# Patient Record
Sex: Female | Born: 1990 | Race: Black or African American | Hispanic: No | Smoking: Former smoker
Health system: Southern US, Community
[De-identification: ages and names within clinical notes are randomized; demographics above are authoritative.]

## PROBLEM LIST (undated history)

## (undated) DIAGNOSIS — F909 Attention-deficit hyperactivity disorder, unspecified type: Secondary | ICD-10-CM

## (undated) DIAGNOSIS — K589 Irritable bowel syndrome without diarrhea: Secondary | ICD-10-CM

## (undated) DIAGNOSIS — K219 Gastro-esophageal reflux disease without esophagitis: Secondary | ICD-10-CM

## (undated) DIAGNOSIS — F32A Depression, unspecified: Secondary | ICD-10-CM

## (undated) DIAGNOSIS — F329 Major depressive disorder, single episode, unspecified: Secondary | ICD-10-CM

## (undated) HISTORY — DX: Depression, unspecified: F32.A

## (undated) HISTORY — DX: Major depressive disorder, single episode, unspecified: F32.9

## (undated) HISTORY — DX: Gastro-esophageal reflux disease without esophagitis: K21.9

## (undated) HISTORY — DX: Attention-deficit hyperactivity disorder, unspecified type: F90.9

## (undated) HISTORY — DX: Irritable bowel syndrome, unspecified: K58.9

---
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
## 2012-01-21 ENCOUNTER — Telehealth: Payer: Self-pay | Admitting: *Deleted

## 2012-01-21 DIAGNOSIS — N938 Other specified abnormal uterine and vaginal bleeding: Secondary | ICD-10-CM

## 2012-01-21 NOTE — Telephone Encounter (Signed)
Pt mother called saying pt is still having irregular bleeding while on her Ortho-Novum 1/35 she is second pack and now on period. Mother states that when pt was taking medication twice daily x1 as noted on 12/04/11 she would not bleed, but once she started back on 1 po daily bleeding would start. Mother said pt has had a lot of bleeding while on these pills.  Pt would like to try something else, OV offered, mother wanted me to check with you. Please advise

## 2012-01-22 MED ORDER — NORETHINDRONE-MESTRANOL 1-50 MG-MCG PO TABS: 1.0000 | ORAL_TABLET | Freq: Every day | ORAL | Status: DC

## 2012-01-22 NOTE — Telephone Encounter (Signed)
She tried Depo-Provera although one of the side effects is irregular bleeding. It's not heavy bleeding to spotting on and off. A lot of patient's stop bleeding with it so it could be a choice but she would have to accept the possibility of irregular bleeding.

## 2012-01-22 NOTE — Telephone Encounter (Signed)
Spoke with Marylu Lund ( pt mother) and pt told her last night that she would like to try depo-provera shot, her mother wasn't sure if this was an option for her? Please advise

## 2012-01-22 NOTE — Telephone Encounter (Signed)
Going to switch her to a higher dose pill Ortho-Novum 1/50 x3 packs and have her call me and we'll see how she's doing.

## 2012-01-23 MED ORDER — MEDROXYPROGESTERONE ACETATE 150 MG/ML IM SUSP: 150.0000 mg | Freq: Once | INTRAMUSCULAR | Status: DC

## 2012-01-23 NOTE — Telephone Encounter (Signed)
Rebecca Dyer informed with the below note, pt will have HCG drawn and with negative results she will have depo-shot done while on cycle.

## 2012-01-23 NOTE — Telephone Encounter (Signed)
Pt mother said pt is willing to give it a try. She was informed with all the below.

## 2012-01-23 NOTE — Telephone Encounter (Signed)
To receive the Depo-Provera then she needs to be on irregular menses or be over 2 weeks from intercourse with a negative hCG. Otherwise she can go ahead and receive Depo-Provera 150 mg every 3 months.

## 2012-02-10 ENCOUNTER — Other Ambulatory Visit: Payer: BC Managed Care – PPO

## 2012-02-10 DIAGNOSIS — N938 Other specified abnormal uterine and vaginal bleeding: Secondary | ICD-10-CM

## 2012-02-10 LAB — HCG, SERUM, QUALITATIVE: Preg, Serum: NEGATIVE

## 2012-02-19 ENCOUNTER — Ambulatory Visit (INDEPENDENT_AMBULATORY_CARE_PROVIDER_SITE_OTHER): Payer: BC Managed Care – PPO | Admitting: Anesthesiology

## 2012-02-19 DIAGNOSIS — Z309 Encounter for contraceptive management, unspecified: Secondary | ICD-10-CM

## 2012-02-19 MED ORDER — MEDROXYPROGESTERONE ACETATE 150 MG/ML IM SUSP
150.0000 mg | Freq: Once | INTRAMUSCULAR | Status: AC
  Administered 2012-02-19: 150 mg via INTRAMUSCULAR

## 2012-02-26 ENCOUNTER — Encounter: Payer: Self-pay | Admitting: Internal Medicine

## 2012-02-26 ENCOUNTER — Ambulatory Visit (INDEPENDENT_AMBULATORY_CARE_PROVIDER_SITE_OTHER): Payer: BC Managed Care – PPO | Admitting: Internal Medicine

## 2012-02-26 VITALS — BP 108/66 | HR 73 | Temp 98.2°F | Ht 63.5 in | Wt 168.0 lb

## 2012-02-26 DIAGNOSIS — Z0000 Encounter for general adult medical examination without abnormal findings: Secondary | ICD-10-CM

## 2012-02-26 DIAGNOSIS — Z23 Encounter for immunization: Secondary | ICD-10-CM

## 2012-02-26 MED ORDER — METHYLPHENIDATE 30 MG/9HR TD PTCH: 1.0000 | MEDICATED_PATCH | Freq: Every day | TRANSDERMAL | Status: DC

## 2012-02-26 NOTE — Assessment & Plan Note (Addendum)
Patient consulted with student health office at [employer], they recommend a Tdap  and Menactra. Shots provided. Reports she already had a gardasil. Discussed diet and exercise Discussed safe driving, risks of alcohol-drugs-tobacco, safe sex. 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 Other issues: She showed me a area at the soft palpate that is pale, she had surgery there, recommend to see her dentist about it. Has a preauricular LAD, recommend observation, if the lesion ever gets larger and doesn't go back to normal she needs to let me know

## 2012-02-26 NOTE — Progress Notes (Signed)
  Subjective:    Patient ID: Rebecca Dyer, female    DOB: 1990/11/12, 21 y.o.   MRN: [id number]  HPI CPX  Past medical history ADHD, dx age 4 DUB, Dr Audie Box  Past surgical history piloric  stenosis, 1994 Umbilical hernia repair 1998  Social history Student at  [employer]  single, G0 Lives w/ mom Tobacco--no ETOH-- no  Family history Adopted, limited knowledge about her blood related family  Diabetes--no CAD--no Stroke--no Colon cancer--no Breast cancer-- aunt    Review of Systems Doing well, ADHD symptoms well-controlled Denies any chest pain or shortness of breath. No DOE-syncope No nausea, vomiting, diarrhea or blood in the stools. Denies dysuria or gross hematuria. No anxiety or depression. Has a white spot at the soft palate were surgery was done before. Has a preauricular LAD which was slightly swollen last week, now back to normal.    Objective:   Physical Exam General -- alert, well-developed, and well-nourished.   Neck --no thyromegaly  HEENT -- L side:  < 1 cm preauricular soft mass, c/w a LAD Lungs -- normal respiratory effort, no intercostal retractions, no accessory muscle use, and normal breath sounds.   Heart-- normal rate, regular rhythm, no murmur, and no gallop.   Extremities-- no pretibial edema bilaterally Neurologic-- alert & oriented X3 and strength normal in all extremities. Psych-- Cognition and judgment appear intact. Alert and cooperative with normal attention span and concentration.  not anxious appearing and not depressed appearing.       Assessment & Plan:

## 2012-02-26 NOTE — Patient Instructions (Addendum)
Please try again to get records from Dr. Samuel Bouche

## 2012-03-05 ENCOUNTER — Ambulatory Visit: Payer: BC Managed Care – PPO | Admitting: Internal Medicine

## 2012-03-12 ENCOUNTER — Ambulatory Visit: Payer: BC Managed Care – PPO | Admitting: Internal Medicine

## 2012-05-14 ENCOUNTER — Encounter (HOSPITAL_COMMUNITY): Payer: Self-pay | Admitting: Psychology

## 2012-05-14 ENCOUNTER — Ambulatory Visit (INDEPENDENT_AMBULATORY_CARE_PROVIDER_SITE_OTHER): Payer: BC Managed Care – PPO | Admitting: Psychology

## 2012-05-14 ENCOUNTER — Encounter (HOSPITAL_COMMUNITY): Payer: Self-pay

## 2012-05-14 DIAGNOSIS — F331 Major depressive disorder, recurrent, moderate: Secondary | ICD-10-CM

## 2012-05-14 DIAGNOSIS — F909 Attention-deficit hyperactivity disorder, unspecified type: Secondary | ICD-10-CM

## 2012-05-14 DIAGNOSIS — F339 Major depressive disorder, recurrent, unspecified: Secondary | ICD-10-CM | POA: Insufficient documentation

## 2012-05-14 DIAGNOSIS — IMO0002 Reserved for concepts with insufficient information to code with codable children: Secondary | ICD-10-CM | POA: Insufficient documentation

## 2012-05-14 NOTE — Progress Notes (Signed)
Patient:   Bryan Moldenhauer   DOB:   21-Nov-1990  MR Number:  [id number]  Location:  [facility] [address] [place] [address] Dept: [phone]           Date of Service:   05/14/12  Start Time:   10.20am End Time:   11.40am  Provider/Observer:  Forde Radon [facility]       Billing Code/Service: [id number]  Chief Complaint:     Chief Complaint  Patient presents with  . Depression  . Mood Swings    Reason for Service:  Pt reports "just not happy, feeling really irritable".  Pt reports 2 major depressive episodes in high school and reports increased depressed feelings in past month.   Pt reports for past month she and girlfriend have been "on and off" and girlfriend ended the relationship this past weekend.    Current Status:  Pt reports withdrawing "By self a lot, don't talk a lot to people".  Pt reports decreased appetite "Don't eat as much- forcing self to eat."  Pt reports Loss of interest socially.  Pt reports sleeping 6-8 hours w/ melatonin.  Pt denies any manic episodes- but does report easily irritable.    Reliability of Information: Pt provided information.  Mother accompanied to office- pt preferred to meet individually.  Behavioral Observation: Kema Reding  presents as a 21 y.o.-year-old African American Female who appeared her stated age. her dress was Appropriate and she was Well Groomed and her manners were Appropriate to the situation.  There were not any physical disabilities noted.  she displayed an appropriate level of cooperation and motivation.    Interactions:    Active   Attention:   within normal limits  Memory:   within normal limits  Visuo-spatial:   not examined  Speech (Volume):  normal  Speech:   soft  Thought Process:  Coherent and Relevant  Though Content:  WNL  Orientation:   person, place, time/date and  situation  Judgment:   Good  Planning:   Good  Affect:    Depressed  Mood:    Depressed  Insight:   Good  Intelligence:   normal  Marital Status/Living: Pt lives adoptive mom and youngest brother 15y/o, bradford.  Pt has 2 older siblings issac 22y/o and Jaquon 20y/o living in [place].  She reports Ree Kida just released from jail which was in for 1 month, saw for a day then has been gone. Pets - 2 pomp-[place].  "Always had a dog". Pt reports not close to either of brothers but more like issac.  Pt reprots gets along w/ mom.  Adopted at 2months old.  Pt reported she had contact w/ birth father till 89yrs old.  Pt reports no contact w/ birth mother.   Social Hx:   4th grade greatgm came to live with them w/ [place]- died 5th grade year.   Maternal gm passed in 7th grade lung cancer- missed a  Month of school as went to [place].  Pt reports over weekend relationship w/ girlfriend of 2 years- Herbert Seta- ended.  Pt participated in band in [employer]ight school and very involved w/ ROTC in [place].  Pt reports love for animals.  Pt reports some of her close friends are still in the area- others have gone off to different colleges of of town.  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  Past Employment:  N/a- mom works Public affairs consultant.    Substance Use:  There is a documented history of marijuana abuse confirmed by the patient.  Pt reports that she last used marijuana last weekend- using 3 times in month of Sept 2013 after several months of no use.  Pt reports that first use of marijuana in 11th grade.  She reported that for one month in 11th grade used 2-3 times a week and then cut back to 1 a week.  She reported in 12th grade less frequent use of marijuana.  Pt denied use of any other drugs or alcohol.    Education:   HS Graduate from [employer]- June 2013.  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  Pt reports that she is doing well in school.    Medical  History:   Past Medical History  Diagnosis Date  . ADHD (attention deficit hyperactivity disorder)         Outpatient Encounter Prescriptions as of 05/14/2012  Medication Sig Dispense Refill  . medroxyPROGESTERone (DEPO-PROVERA) 150 MG/ML injection Inject 1 mL (150 mg total) into the muscle once.  1 mL  2  . methylphenidate (DAYTRANA) 30 MG/9HR Place 1 patch onto the skin daily. wear patch for 9 hours only each day  30 patch  0        Pt reports taking Depo-Provera for irregular/heavy menstrual cycle and reports only helping slightly.  Sexual History:   History  Sexual Activity  . Sexually Active: Not Currently  Pt has been sexually active in homosexual relationship in the past.  Abuse/Trauma History: Pt denies any abuse or trauma.  Psychiatric History:  Pt no hx of counseling.  Pt did report attended counseling session at [employer] but not good fit- doesn't plan on returning.  Family Med/Psych History:  Family History  Problem Relation Age of Onset  . Adopted: Yes  . Breast cancer Maternal Aunt   . Drug abuse Mother- biological   . Drug abuse Father- biological     Risk of Suicide/Violence: low pt reports SI in past month- fleeting thoughts.  Pt denies any intent or any plans.  Pt admits to hx of cutting w/ first incident in 7th grade, then sophomore year couple of times, then 1-2 times senior year and summer 2013 1 week of cutting several times that week.  Pt denies any cutting since summer and reports didn't cut w/ recent increase of stressors.  Impression/DX:  Pt is a 21y/o female who is seeking counseling for depression and irritability.  Pt has had a hx of depression beginning in high school.  She reports hx of past cutting, depressed episodes and irritability. Pt denied any manic episodes.  Pt reported over past month w/ relationship difficulties depression has become more severe and most recent stressor of breakup 4 days ago.  Pt admitted to use of marijuana in past month after  not using for several months.  Pt reports recent fleeting passive SI w/out intent or plan- no active SI today.  Pt receptive to counseling and potential of medication management for depression.  Disposition/Plan:  F/U w/ pt in 1 week for counseling.  Pt referred for psychiatric evaluation.  Diagnosis:    Axis I:   1. ADHD (attention deficit hyperactivity disorder)   2. Major depressive disorder, recurrent episode, moderate         Axis II: No diagnosis       Axis III:  none      Axis IV:  problems related to social environment and problems with primary support  group          Axis V:  41-50 serious symptoms

## 2012-05-18 ENCOUNTER — Other Ambulatory Visit: Payer: Self-pay | Admitting: *Deleted

## 2012-05-18 ENCOUNTER — Encounter (HOSPITAL_COMMUNITY): Payer: Self-pay | Admitting: Psychiatry

## 2012-05-18 ENCOUNTER — Ambulatory Visit (INDEPENDENT_AMBULATORY_CARE_PROVIDER_SITE_OTHER): Payer: BC Managed Care – PPO | Admitting: Psychiatry

## 2012-05-18 VITALS — BP 121/86 | HR 81 | Wt 171.0 lb

## 2012-05-18 DIAGNOSIS — F988 Other specified behavioral and emotional disorders with onset usually occurring in childhood and adolescence: Secondary | ICD-10-CM

## 2012-05-18 DIAGNOSIS — F329 Major depressive disorder, single episode, unspecified: Secondary | ICD-10-CM

## 2012-05-18 MED ORDER — FLUOXETINE HCL 10 MG PO CAPS: ORAL_CAPSULE | ORAL | Status: DC

## 2012-05-18 MED ORDER — MEDROXYPROGESTERONE ACETATE 150 MG/ML IM SUSP: 150.0000 mg | Freq: Once | INTRAMUSCULAR | Status: DC

## 2012-05-18 NOTE — Progress Notes (Signed)
Chief complaint Patient is self-referred for seeking treatment and evaluation.  Patient has a long history of ADD.  She is taking medication for ADD from her pediatrician.  For past few months patient has noticed more depression irritability and angry episodes.  She broke up with her girlfriend and noticed getting easily irritable upset agitated and sometimes feeling hopeless and helpless.  She also endorse poor sleep racing thoughts and frequent crying spells.  She admitted social isolation and decreased desire to do anything.  It is unclear why she broke up but endorse that her partner also has a lot of issues and could not keep up with relationship.  She recently seen therapist in this office with the hope that her depression get better.  Patient does not see any improvement in her mood and remains very unhappy.  Her mother who is a Engineer, site encourage her to see psychiatrist .  She wonders if the ADD medicine is contributing her depression.  Patient also started Depo-Provera 2 months ago since her previous medicine does not work very well.  It is around the same time patient decided to have more irritability .  Patient is worried about her depression as neatly she is having passive suicidal thoughts but denies any active suicidal thoughts or plan.  She wants to get better.  She's not drinking or using any illegal substance.  She endorse improvement in her attention and focus with 80 medication she continues to have decreased energy fatigue and anhedonia .  Patient denies any panic attack anxiety attack or any obsessive thoughts.  Current psychiatric medication  Daytrana 30 mg patch for 9 hours every day  Past psychiatric history Patient any previous history of psychiatric inpatient treatment or any suicidal attempt.  Patient endorse history of cutting herself in her early grades.  She's been diagnosed with ADD at her third grade and since then she is taking this medication.  Patient admitted at least  2-3 depressive episodes when she had broken up with her girlfriend, there was a death in the family and her mother was sick.  She endorse that any stress in her life she does not take very well.  She gets very depressed.  Patient denies any hallucination paranoia or any manic-like symptoms.  She denies taking any other psychiatric medication.  Family history Patient endorse her about the mother has history of substance abuse.  She do not know about her other family members.  Psychosocial history Patient was born and raised in [place].  She was adopted at very early age.  Apparently her parents her substance user.  Patient has a good relationship with her adopted mother.  Patient lives with her and 26 year old biological brother.  Patient is a Printmaker at [employer].  Patient is homosexual.  History of trauma or abuse Patient denies any history of trauma or abuse.  Education and work history Patient is currently Printmaker.  She does not work.  Alcohol and substance use history Patient denies any history of alcohol but endorse smoking marijuana on and off for past few months.  Her last use was 2 weeks ago. Patient denies any smoking or any intravenous drug use.  Medical history Patient see Dr. Claudette Stapler.  She has history of a medical hernia repair and pyloric stenosis.  Review of systems Patient endorse anxiety but denies any obsession , headache, numbness.  She has insomnia.  Risk of Suicide/Violence: Low.  pt reports SI in past month- fleeting thoughts.  Pt denies any intent  or any plans.  Pt admits to hx of cutting w/ first incident in 7th grade, then sophomore year couple of times, then 1-2 times senior year and summer 2013 1 week of cutting several times that week.  Pt denies any cutting since summer and reports didn't cut w/ recent increase of stressors.  Mental status emanation Patient is a short stature female who is casually dressed and fairly groomed.  She has short hair.  She  is superficially cooperative and maintained fair eye contact.  Her speech is soft clear but decreased volume and tone.  Her thought processes slow but logical linear and goal-directed.  She described her mood is depressed and her affect is constricted.  She denies any active suicidal thinking and homicidal thinking but endorse passive and fleeting suicidal thoughts.  There were no delusion or psychotic symptoms present.  There were no tremors or shakes present.  Her fund of knowledge is adequate.  There were no flight of idea or loose association.  She denies any auditory or visual hallucination.  Her attention and concentration is fair.  She's alert and oriented x3.  Her insight judgment and impulse control is okay.  Diagnoses Axis I ADD , depressive disorder recurrent  Axis II deferred Axis III see medical history Axis IV moderate   Formulation and plan.   Patient is a 21y/o female who is seeking treatment for depression and irritability.  Pt has had a hx of depression beginning in high school.  She reports hx of past cutting, depressed episodes and irritability. Pt denied any manic episodes.  Pt reported over past month w/ relationship difficulties depression has become more severe and most recent stressor of breakup.  Around the same time she started Depo-Provera which could be contributing to her  mood irritability.  She also admitted using marijuana in past month after not using for several months.  Pt reports recent fleeting passive SI w/out intent or plan- no active SI today.  I recommend to try antidepressant which she had never tried before.  She agreed to give a trial.  I will start Prozac 10 mg daily starting today and in one week she will gradually take 20 mg.  I explain in detail the risk and benefits of medication especially metabolic side effects.  I also recommend to call us if she has any question or concern if she feels worsening of the symptom.  We discussed safety plan and provided  crisis intervention contact that anytime having suicidal thoughts or homicidal thoughts and she need to call 911 , crisis hotline or go to local emergency room.  She will see therapist for coping and social skills.  I will see her again in 2 weeks.  Portion of this note is generated with voice dictation software and may contain typographical error.

## 2012-05-18 NOTE — Telephone Encounter (Signed)
Pt called requesting refill on depo provera shot, rx sent.

## 2012-05-19 ENCOUNTER — Encounter: Payer: Self-pay | Admitting: Gynecology

## 2012-05-22 ENCOUNTER — Ambulatory Visit (HOSPITAL_COMMUNITY): Payer: Self-pay | Admitting: Psychology

## 2012-05-22 ENCOUNTER — Ambulatory Visit (INDEPENDENT_AMBULATORY_CARE_PROVIDER_SITE_OTHER): Payer: BC Managed Care – PPO | Admitting: Anesthesiology

## 2012-05-22 DIAGNOSIS — N949 Unspecified condition associated with female genital organs and menstrual cycle: Secondary | ICD-10-CM

## 2012-05-22 DIAGNOSIS — N938 Other specified abnormal uterine and vaginal bleeding: Secondary | ICD-10-CM

## 2012-05-22 MED ORDER — MEDROXYPROGESTERONE ACETATE 150 MG/ML IM SUSP
150.0000 mg | Freq: Once | INTRAMUSCULAR | Status: AC
  Administered 2012-05-22: 150 mg via INTRAMUSCULAR

## 2012-06-05 ENCOUNTER — Ambulatory Visit (INDEPENDENT_AMBULATORY_CARE_PROVIDER_SITE_OTHER): Payer: Self-pay | Admitting: Psychology

## 2012-06-05 DIAGNOSIS — F331 Major depressive disorder, recurrent, moderate: Secondary | ICD-10-CM

## 2012-06-05 NOTE — Progress Notes (Signed)
   THERAPIST PROGRESS NOTE  Session Time: 3:40pm-4:25pm  Participation Level: Active  Behavioral Response: Well GroomedAlertEuthymic  Type of Therapy: Individual Therapy  Treatment Goals addressed: Diagnosis: MDD and goal 1.  Interventions: CBT and Strength-based  Summary: Rebecca Dyer is a 21 y.o. female who presents with full and bright affect.  Pt reports that her mood is improved- less depressed which she attributes to the medication.  Pt also reported that same day started medication she reunited with her girlfriend, heather.  Pt reports she has increased w/ social interaction.  Pt did report feeling fatigued however.  Pt reports she sleeps well at night- but thinks the 20mg  dose of Prozac is making her drowsy.  Pt agreed to discuss w/ Dr.Arfeen.  Pt reported that she is looking forward to social outing tonight and tomorrow.  Pt discussed major stressor is school- but doing well w/ getting things accomplished.  Pt discussed weight gain as bothering her and recognized need to find opportunity for exercise again.  .   Suicidal/Homicidal: Nowithout intent/plan  Therapist Response: Assessed pt current functioning per pt report.  Processed w/pt her report of improved mood and potential contributing factors.  Explored w/pt increased social interactions and discussed importance of this for mood.  Explored w/pt other areas for positive self care that would be beneficial- exercise and independence through driving.   Plan: Return again in 2 weeks.  Diagnosis: Axis I: MDD, mild    Axis II: No diagnosis    Mckaylee Dimalanta, LPC 06/05/2012

## 2012-06-08 ENCOUNTER — Encounter (HOSPITAL_COMMUNITY): Payer: Self-pay | Admitting: Psychiatry

## 2012-06-08 ENCOUNTER — Ambulatory Visit (INDEPENDENT_AMBULATORY_CARE_PROVIDER_SITE_OTHER): Payer: Self-pay | Admitting: Psychiatry

## 2012-06-08 VITALS — BP 131/83 | HR 76 | Wt 179.6 lb

## 2012-06-08 DIAGNOSIS — F329 Major depressive disorder, single episode, unspecified: Secondary | ICD-10-CM

## 2012-06-08 MED ORDER — FLUOXETINE HCL 20 MG PO CAPS: 20.0000 mg | ORAL_CAPSULE | Freq: Every day | ORAL | Status: DC

## 2012-06-08 NOTE — Progress Notes (Signed)
[facility] [id number] Progress Note  Rebecca Dyer [id number] 21 y.o.  06/08/2012 2:23 PM  Chief Complaint: I stop taking Daytrana patch.  History of Present Illness: Patient came for her followup appointment.  She is taking Prozac 20 mg daily.  She endorse much improvement with Prozac.  She is less irritable and less angry.  She believe that her relationship is also improved from the past.  She denies that she has a lot of agitation anger which is causing issues in the relationship.  She is less depressed.  She sleeping better however she sometimes feels very tired with Prozac.  There are no tremors or shakes.  She denies any recent agitation or crying spells.  She is seeing therapist and wants to continue her current dose of Prozac.  She stopped taking Daytrana patch, she does not want to take the medication.  She has gained some weight since a stimulant is stopped but she does not show any relapse into her attention and concentration.  She's not drinking or using any illegal substance.  She endorse that her mother also noticed improvement in her mood. Suicidal Ideation: No Plan Formed: No Patient has means to carry out plan: No  Homicidal Ideation: No Plan Formed: No Patient has means to carry out plan: No  Review of Systems: Psychiatric: Agitation: No Hallucination: No Depressed Mood: No Insomnia: No Hypersomnia: No Altered Concentration: No Feels Worthless: No Grandiose Ideas: No Belief In Special Powers: No New/Increased Substance Abuse: No Compulsions: No  Neurologic: Headache: No Seizure: No Paresthesias: No  Past psychiatric history.  Patient denies any previous history of psychiatric inpatient treatment or any suicidal attempt.  She admitted history of cutting herself in school but denies any recent episode.  She's been diagnosed with ADD on her third grade.  She was given Daytrana patch onto recently she stopped.  Patient also admitted history of at least 2-3  depressive episodes when she broke up with her girlfriend.  Patient denies any history of hallucination paranoia or any manic-like symptoms.  Medical history Patient has history of medical hernia repair and pyloric stenosis.  She is mildly obese.  Her primary care physician is Dr. Claudette Stapler.  Psychosocial history Patient was born and raised in [place].  She was adopted in early age.  Apparently her parents are substance abuser.  Patient has a very good relationship with her adoptive mother.  Patient lives with her biological brother and her adopted mother.  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  Outpatient Encounter Prescriptions as of 06/08/2012  Medication Sig Dispense Refill  . FLUoxetine (PROZAC) 20 MG capsule Take 1 capsule (20 mg total) by mouth daily.  30 capsule  1  . medroxyPROGESTERone (DEPO-PROVERA) 150 MG/ML injection Inject 1 mL (150 mg total) into the muscle once.  1 mL  1  . DISCONTD: FLUoxetine (PROZAC) 10 MG capsule Take 1 daily for 1 week and than 2 cap daily  45 capsule  0  . DISCONTD: methylphenidate (DAYTRANA) 30 MG/9HR Place 1 patch onto the skin daily. wear patch for 9 hours only each day  30 patch  0    Past Psychiatric History/Hospitalization(s): Anxiety: Yes Bipolar Disorder: No Depression: Yes Mania: No Psychosis: No Schizophrenia: No Personality Disorder: No Hospitalization for psychiatric illness: No History of Electroconvulsive Shock Therapy: No Prior Suicide Attempts: No however history of self abusive behavior.  Physical Exam: Constitutional:  BP 131/83  Pulse 76  Wt 179 lb 9.6 oz (81.466  kg)  General Appearance: alert, oriented, no acute distress and obese  Musculoskeletal: Strength & Muscle Tone: within normal limits Gait & Station: normal Patient leans: N/A  Psychiatric: Speech (describe rate, volume, coherence, spontaneity, and abnormalities if any): Clear and coherent with normal tone volume  Thought Process  (describe rate, content, abstract reasoning, and computation): Logical and goal-directed  Associations: Coherent, Relevant and Intact  Thoughts: normal  Mental Status: Orientation: oriented to person, place, time/date and situation Mood & Affect: normal affect Attention Span & Concentration: Fair  Medical Decision Making (Choose Three): Established Problem, Stable/Improving (1), Review of Psycho-Social Stressors (1), Review of Last Therapy Session (1), Review or order medicine tests (1), Review of Medication Regimen & Side Effects (2) and Review of New Medication or Change in Dosage (2)  Assessment: Axis I: Depressive disorder  Axis II: Deferred  Axis III: See Medical history  Axis IV: Mild to moderate  Axis V: 55-65   Plan: I review her psychosocial stressors, response to the medication and side effects.  Patient has some concern about Prozac as patient feel tired but there has been no other issues.  I recommend to continue Prozac 20 mg and if her feeling of tiredness does not resolve in 2 weeks then she should call us.  She will see therapist for coping and social skills.  Overall her mood anger is much improved from the past.  She is not taking his stimulants at this time.  I recommend to call us if she is any question or concern about the medication if he feel worsening of the symptom.  I will see her again in 6 weeks.  More than 50% of the time spent and psychoeducation, counseling and coordination of care.  ARFEEN,SYED T., MD 06/08/2012

## 2012-06-10 ENCOUNTER — Encounter: Payer: Self-pay | Admitting: Gynecology

## 2012-06-10 ENCOUNTER — Ambulatory Visit (INDEPENDENT_AMBULATORY_CARE_PROVIDER_SITE_OTHER): Payer: BC Managed Care – PPO | Admitting: Gynecology

## 2012-06-10 VITALS — BP 112/70 | Ht 63.5 in | Wt 180.0 lb

## 2012-06-10 DIAGNOSIS — Z01419 Encounter for gynecological examination (general) (routine) without abnormal findings: Secondary | ICD-10-CM

## 2012-06-10 DIAGNOSIS — N926 Irregular menstruation, unspecified: Secondary | ICD-10-CM

## 2012-06-10 MED ORDER — MEDROXYPROGESTERONE ACETATE 150 MG/ML IM SUSP: 150.0000 mg | Freq: Once | INTRAMUSCULAR | Status: DC

## 2012-06-10 NOTE — Patient Instructions (Signed)
Continue with Depo-Provera every 3 months. Follow up for GYN exam in 1 year.

## 2012-06-10 NOTE — Progress Notes (Signed)
Rebecca Dyer 22-Mar-1991 [id number]        21 y.o.  G0P0 for annual exam.   Past medical history,surgical history, medications, allergies, family history and social history were all reviewed and documented in the EPIC chart. ROS:  Was performed and pertinent positives and negatives are included in the history.  Exam: [name] assistant Filed Vitals:   06/10/12 1548  BP: 112/70  Height: 5' 3.5" (1.613 m)  Weight: 180 lb (81.647 kg)   General appearance  Normal Skin grossly normal Head/Neck normal with no cervical or supraclavicular adenopathy thyroid normal Lungs  clear Cardiac RR, without RMG Abdominal  soft, nontender, without masses, organomegaly or hernia Breasts  without masses, retractions, discharge or axillary adenopathy. Pelvic  Ext/BUS/vagina  normal   Cervix  normal   Uterus  anteverted, normal size, shape and contour, midline and mobile nontender   Adnexa  Without masses or tenderness    Anus and perineum  normal      Assessment/Plan:  21 y.o. G0P0 female for annual exam.   1. Recent workup for irregular bleeding to include a normal sonohysterogram and biopsy. Had been on Implanon than oral contraceptives and now is on Depo-Provera and doing well with scant bleeding. Will continue on Depo-Provera at her choice. I refilled her times a year. 2. Breast health. SBE monthly reviewed. 3. Pap smear. Will initiate at age 27 per current screening guidelines. 4. STD screening. Patient not sexually active and she declined. 5. Gardasil. Patient reports having series in high school. 6. Health maintenance. Hemoglobin 11 earlier this year. We'll repeat CBC today. Otherwise no other blood work done. Assuming she continues well on Depo-Provera will see me in a year.   Dara Lords MD, 4:12 PM 06/10/2012

## 2012-06-11 LAB — CBC WITH DIFFERENTIAL/PLATELET
Basophils Relative: 1 % (ref 0–1)
Eosinophils Absolute: 0.2 10*3/uL (ref 0.0–0.7)
Eosinophils Relative: 3 % (ref 0–5)
MCH: 28.9 pg (ref 26.0–34.0)
MCHC: 33.3 g/dL (ref 30.0–36.0)
MCV: 86.8 fL (ref 78.0–100.0)
Monocytes Relative: 7 % (ref 3–12)
Neutrophils Relative %: 47 % (ref 43–77)
Platelets: 354 10*3/uL (ref 150–400)

## 2012-06-26 ENCOUNTER — Ambulatory Visit (INDEPENDENT_AMBULATORY_CARE_PROVIDER_SITE_OTHER): Payer: Self-pay | Admitting: Psychology

## 2012-06-26 ENCOUNTER — Encounter (HOSPITAL_COMMUNITY): Payer: Self-pay

## 2012-06-26 DIAGNOSIS — IMO0002 Reserved for concepts with insufficient information to code with codable children: Secondary | ICD-10-CM

## 2012-06-26 NOTE — Progress Notes (Signed)
   THERAPIST PROGRESS NOTE  Session Time: 3:50pm-4:25pm  Participation Level: Active  Behavioral Response: Well GroomedAlertEuthymic  Type of Therapy: Individual Therapy  Treatment Goals addressed: Diagnosis: MDD and goal 1.  Interventions: CBT and Strength-based  Summary: Rebecca Dyer is a 21 y.o. female who presents with full and bright affect.  Pt reports she is doing very well and that mood is much improved.  Pt denies any depressed mood.  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  Pt reported she was able to get her pet rat today and is excited about this. Pt informed that she has made some new friends and has become very close to them- they are students at [employer].  Pt also reported her relationship is continuing to do well.  Pt discussed her major stressor is financial and recognized need to budget.    Suicidal/Homicidal: Nowithout intent/plan  Therapist Response: Assessed pt current functioning per pt report. PRocessed w/pt improvements and positive self care activities.  Explored interactions w/ friends and discussed benefits this has.  Discussed pt stressor of finances and encouraged pt to make a budget.  Plan: Return again in 4 weeks.  Diagnosis: Axis I: MDD    Axis II: No diagnosis    YATES,LEANNE, LPC 06/26/2012

## 2012-07-20 ENCOUNTER — Encounter (HOSPITAL_COMMUNITY): Payer: Self-pay | Admitting: Psychiatry

## 2012-07-20 ENCOUNTER — Ambulatory Visit (INDEPENDENT_AMBULATORY_CARE_PROVIDER_SITE_OTHER): Payer: Self-pay | Admitting: Psychiatry

## 2012-07-20 VITALS — BP 125/79 | HR 82 | Wt 180.0 lb

## 2012-07-20 DIAGNOSIS — F329 Major depressive disorder, single episode, unspecified: Secondary | ICD-10-CM

## 2012-07-20 MED ORDER — FLUOXETINE HCL 20 MG/5ML PO SOLN: 20.0000 mg | Freq: Every day | ORAL | Status: DC

## 2012-07-20 NOTE — Progress Notes (Signed)
Patient ID: Rebecca Dyer, female   DOB: 06-Jan-1991, 21 y.o.   MRN: [id number]  [facility] [id number] Progress Note  Tenna Lacko [id number] 21 y.o.  07/20/2012 3:53 PM  Chief Complaint: I am feeling better with Prozac.    History of Present Illness: Patient came for her followup appointment.  She is taking Prozac 20 mg daily.  However she does not like pills and switched to liquid form.  She started to liquid form better than tablet form.  She does not feed any more tired or dizzy.  Her attention and concentration is improved from the past.  She recently finished her semester.  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  She likes her job.  She denies any recent crying spells, agitation, anger or any mood swings.  She denies any tremors or shakes.  She sleeping better.  She's not taking any 80 medication.  She's not drinking or using any illegal substance.  She denies any recent incident of self abusive behavior.  Suicidal Ideation: No Plan Formed: No Patient has means to carry out plan: No  Homicidal Ideation: No Plan Formed: No Patient has means to carry out plan: No  Review of Systems: Psychiatric: Agitation: No Hallucination: No Depressed Mood: No Insomnia: No Hypersomnia: No Altered Concentration: No Feels Worthless: No Grandiose Ideas: No Belief In Special Powers: No New/Increased Substance Abuse: No Compulsions: No  Neurologic: Headache: No Seizure: No Paresthesias: No  Past psychiatric history.  Patient denies any previous history of psychiatric inpatient treatment or any suicidal attempt.  She admitted history of cutting herself in school but denies any recent episode.  She's been diagnosed with ADD on her third grade.  She was given Daytrana patch onto recently she stopped.  Patient also admitted history of at least 2-3 depressive episodes when she broke up with her girlfriend.  Patient denies any history of hallucination  paranoia or any manic-like symptoms.  Medical history Patient has history of medical hernia repair and pyloric stenosis.  She is mildly obese.  Her primary care physician is Dr. Claudette Stapler.  Psychosocial history Patient was born and raised in [place].  She was adopted in early age.  Apparently her parents are substance abuser.  Patient has a very good relationship with her adoptive mother.  Patient lives with her biological brother and her adopted mother.  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  Outpatient Encounter Prescriptions as of 07/20/2012  Medication Sig Dispense Refill  . medroxyPROGESTERone (DEPO-PROVERA) 150 MG/ML injection Inject 1 mL (150 mg total) into the muscle once.  1 mL  3  . [DISCONTINUED] FLUoxetine (PROZAC) 20 MG capsule Take 1 capsule (20 mg total) by mouth daily.  30 capsule  1  . FLUoxetine (PROZAC) 20 MG/5ML solution Take 5 mLs (20 mg total) by mouth daily.  120 mL  1    Past Psychiatric History/Hospitalization(s): Anxiety: Yes Bipolar Disorder: No Depression: Yes Mania: No Psychosis: No Schizophrenia: No Personality Disorder: No Hospitalization for psychiatric illness: No History of Electroconvulsive Shock Therapy: No Prior Suicide Attempts: No however history of self abusive behavior.  Physical Exam: Constitutional:  BP 125/79  Pulse 82  Wt 180 lb (81.647 kg)  General Appearance: alert, oriented, no acute distress and obese  Musculoskeletal: Strength & Muscle Tone: within normal limits Gait & Station: normal Patient leans: N/A  Psychiatric: Speech (describe rate, volume, coherence, spontaneity, and abnormalities if any):  Clear and coherent with normal tone volume  Thought Process (describe rate, content, abstract reasoning, and computation): Logical and goal-directed  Associations: Coherent, Relevant and Intact  Thoughts: normal  Mental Status: Orientation: oriented to person, place, time/date and situation Mood &  Affect: normal affect Attention Span & Concentration: Fair  Medical Decision Making (Choose Three): Established Problem, Stable/Improving (1), Review of Psycho-Social Stressors (1), Review of Last Therapy Session (1), Review or order medicine tests (1), Review of Medication Regimen & Side Effects (2) and Review of New Medication or Change in Dosage (2)  Assessment: Axis I: Depressive disorder  Axis II: Deferred  Axis III: See Medical history  Axis IV: Mild to moderate  Axis V: 55-65   Plan: I will change her Prozac to liquid form.  Patient tolerates take work home better than tablet form.  I discuss risk and benefits of medication in detail.  I recommend to call us if she is any question or concern about the medication.  I will see her again in 2 months.  More than 50% of the time spent and psychoeducation, counseling and coordination of care.  ARFEEN,SYED T., MD 07/20/2012

## 2012-07-27 ENCOUNTER — Ambulatory Visit (HOSPITAL_COMMUNITY): Payer: Self-pay | Admitting: Psychology

## 2012-08-21 ENCOUNTER — Telehealth: Payer: Self-pay

## 2012-08-21 NOTE — Telephone Encounter (Signed)
I would recommend short course of megace 20 mg daily for 2 weeks.  See if this stops the heavier bleeding.ometimes we add estrogen if prolonged light bleeding or start on BCP.

## 2012-08-21 NOTE — Telephone Encounter (Signed)
Patient states since shortly after she was here 06/10/12 she started bleeding off and on.  Sometimes it is heavy.  She is bleeding currently.    I returned her call and mom answered. Patient not available but I do have permission in system to speak with mom.  Mom said that patient received  D-P injection to hopefully suppress bleeding and not for birth control.  It is not working and they want to know what you recommend she do at this point?  Tired of bleeding.  They are aware Dr. Velvet Bathe is out of office and I will check with him and call on Monday. Mom said daughter headed back to school and so I will be talking with her again on Monday when I call.

## 2012-08-24 NOTE — Telephone Encounter (Signed)
Left message to call.

## 2012-08-25 NOTE — Telephone Encounter (Signed)
Left message home phone for mom to call. (message left yesterday was on patient's cell phone.)

## 2012-08-28 ENCOUNTER — Ambulatory Visit: Payer: BC Managed Care – PPO | Admitting: Internal Medicine

## 2012-09-02 NOTE — Telephone Encounter (Signed)
Left another message to call home phone.

## 2012-09-11 ENCOUNTER — Ambulatory Visit (INDEPENDENT_AMBULATORY_CARE_PROVIDER_SITE_OTHER): Payer: BC Managed Care – PPO | Admitting: Internal Medicine

## 2012-09-11 ENCOUNTER — Encounter: Payer: Self-pay | Admitting: *Deleted

## 2012-09-11 ENCOUNTER — Encounter: Payer: Self-pay | Admitting: Internal Medicine

## 2012-09-11 VITALS — BP 122/84 | HR 81 | Temp 98.5°F | Wt 185.0 lb

## 2012-09-11 DIAGNOSIS — F32A Depression, unspecified: Secondary | ICD-10-CM | POA: Insufficient documentation

## 2012-09-11 DIAGNOSIS — F329 Major depressive disorder, single episode, unspecified: Secondary | ICD-10-CM

## 2012-09-11 DIAGNOSIS — F909 Attention-deficit hyperactivity disorder, unspecified type: Secondary | ICD-10-CM | POA: Insufficient documentation

## 2012-09-11 MED ORDER — METHYLPHENIDATE 30 MG/9HR TD PTCH: 1.0000 | MEDICATED_PATCH | Freq: Every day | TRANSDERMAL | Status: DC

## 2012-09-11 NOTE — Progress Notes (Signed)
  Subjective:    Patient ID: Rebecca Dyer, female    DOB: Dec 18, 1990, 22 y.o.   MRN: [id number]  HPI Followup visit. Since the last time she was here, she felt depressed, went to another facility and was prescribed Prozac. Doing better. ADHD, very seldom uses her medication. See assessment and plan.   Past Medical History  Diagnosis Date  . ADHD (attention deficit hyperactivity disorder)   . Depression    Past Surgical History  Procedure Date  . Umbilical hernia repair 1998    as a child  . Pyloric stenosis 1994   History   Social History  . Marital Status: Single    Spouse Name: N/A    Number of Children: N/A  . Years of Education: N/A   Occupational History  . Not on file.   Social History Main Topics  . Smoking status: Never Smoker   . Smokeless tobacco: Never Used  . Alcohol Use: No  . Drug Use: No  . Sexually Active: Not Currently   Other Topics Concern  . Not on file   Social History Narrative  . No narrative on file     Review of Systems Denies anxiety. Good compliance with birth control shots at Dr. Katherina Right office. Denies any suicidal ideas. Long history of insomnia, mostly waking up in the middle of the night. Not severe. As noted, she takes her ADD medication infrequently consequently I don't think is a side effect from the medication    Objective:   Physical Exam  General -- alert, well-developed, and well-nourished. VSS  Lungs -- normal respiratory effort, no intercostal retractions, no accessory muscle use, and normal breath sounds.   Heart-- normal rate, regular rhythm, no murmur, and no gallop.    Psych-- Cognition and judgment appear intact. Alert and cooperative with normal attention span and concentration.  not anxious appearing and not depressed appearing.       Assessment & Plan:

## 2012-09-11 NOTE — Assessment & Plan Note (Addendum)
Uses the medication prn, I have only issue 1 prescription in the last 6 months. She does have some insomnia but not severe and would like to continue taking the medication as needed. Doubt ADHD med  is the culprit of insomnia since she takes it very infrequently. Rx for methylphenidate issue today. Reminded is very important to stay on a birth control method .

## 2012-09-11 NOTE — Assessment & Plan Note (Signed)
Since the last time she was here, she was diagnosed with depression at [facility]. Sx well controlled

## 2012-09-13 ENCOUNTER — Encounter: Payer: Self-pay | Admitting: Internal Medicine

## 2012-09-21 ENCOUNTER — Ambulatory Visit (HOSPITAL_COMMUNITY): Payer: BC Managed Care – PPO | Admitting: Psychiatry

## 2012-11-17 ENCOUNTER — Encounter: Payer: Self-pay | Admitting: Internal Medicine

## 2012-11-26 ENCOUNTER — Encounter (HOSPITAL_COMMUNITY): Payer: Self-pay | Admitting: Psychology

## 2012-12-22 ENCOUNTER — Telehealth: Payer: Self-pay | Admitting: *Deleted

## 2012-12-22 NOTE — Telephone Encounter (Signed)
Pt informed with the below note, transferred to front desk.  

## 2012-12-22 NOTE — Telephone Encounter (Signed)
If her last period was February and she is 2-3 months late she really needs office appointment with evaluation first before considering another shot of Depo-Provera.

## 2012-12-22 NOTE — Telephone Encounter (Signed)
Pt had last depo provera shot on 05/22/12 and never returned around 08/22/12 for next shot. Pt is calling ready to schedule shot, her last menstrual period was in late Feb. She couldn't remember the date,pt said no chance of pregnancy. Please advise the above.

## 2012-12-24 ENCOUNTER — Encounter: Payer: Self-pay | Admitting: Gynecology

## 2012-12-24 ENCOUNTER — Ambulatory Visit (INDEPENDENT_AMBULATORY_CARE_PROVIDER_SITE_OTHER): Payer: BC Managed Care – PPO | Admitting: Gynecology

## 2012-12-24 DIAGNOSIS — Z309 Encounter for contraceptive management, unspecified: Secondary | ICD-10-CM

## 2012-12-24 DIAGNOSIS — N926 Irregular menstruation, unspecified: Secondary | ICD-10-CM

## 2012-12-24 LAB — FOLLICLE STIMULATING HORMONE: FSH: 6.3 m[IU]/mL

## 2012-12-24 LAB — TSH: TSH: 2.741 u[IU]/mL (ref 0.350–4.500)

## 2012-12-24 LAB — HCG, SERUM, QUALITATIVE: Preg, Serum: NEGATIVE

## 2012-12-24 NOTE — Progress Notes (Signed)
Patient presents to discuss irregular menses and contraception. She had been on Depo-Provera with last shot reported in October. Never followed up for another shot. Currently using condoms. Had regular menses in February then a light bleed in May 2 weeks ago. Had been worked up for irregular menses last year to include a negative sonohysterogram with negative endometrial biopsy and normal hormone levels. Had used Implanon previously and attributes or irregularity to this. Did well on Depo-Provera and remained amenorrheic.  Exam with [name] assistant Abdomen soft nontender without masses guarding rebound organomegaly. Pelvic external BUS vagina normal. Cervix normal. Uterus normal size, mobile nontender. Adnexa without masses or tenderness.  Assessment and plan: Irregular menses, patient wants to restart Depo-Provera. Check baseline labs again to include FSH TSH prolactin and hCG. Patient adamantly claims last intercourse one month ago. Assuming hCG negative then will go with Depo-Provera 150 mg every 3 months. Followup in October when she is due for her annual.

## 2012-12-24 NOTE — Patient Instructions (Signed)
Followup for lab results. Return for Depo-Provera once we know the hCG pregnancy test is negative.

## 2012-12-28 ENCOUNTER — Telehealth: Payer: Self-pay | Admitting: *Deleted

## 2012-12-28 NOTE — Telephone Encounter (Signed)
Left message on pt voicemail that HCG negative and okay to make OV with nurse to have Depo-provera shot given. Pet Tf note on 12/24/12.

## 2013-01-06 ENCOUNTER — Encounter (HOSPITAL_COMMUNITY): Payer: Self-pay | Admitting: Psychology

## 2013-01-06 DIAGNOSIS — IMO0002 Reserved for concepts with insufficient information to code with codable children: Secondary | ICD-10-CM

## 2013-01-06 NOTE — Progress Notes (Signed)
Patient ID: Rebecca Dyer, female   DOB: [birth year], 22 y.o.   MRN: [id number] Outpatient Therapist Discharge Summary  Sindee Stucker    11/28/1990   Admission Date: 05/14/12    Discharge Date:  01/06/13  Reason for Discharge:  Not active in counseling Diagnosis:    Major depressive disorder, recurrent episode, in partial or unspecified remission    Comments:  Last seen in counseling 06/2012  Forde Radon

## 2013-02-11 ENCOUNTER — Ambulatory Visit (INDEPENDENT_AMBULATORY_CARE_PROVIDER_SITE_OTHER): Payer: BC Managed Care – PPO | Admitting: Family Medicine

## 2013-02-11 ENCOUNTER — Encounter: Payer: Self-pay | Admitting: Family Medicine

## 2013-02-11 VITALS — BP 100/60 | HR 79 | Temp 98.7°F | Wt 184.8 lb

## 2013-02-11 DIAGNOSIS — J029 Acute pharyngitis, unspecified: Secondary | ICD-10-CM

## 2013-02-11 LAB — POCT RAPID STREP A (OFFICE): Rapid Strep A Screen: NEGATIVE

## 2013-02-11 MED ORDER — PREDNISONE 10 MG PO TABS: ORAL_TABLET | ORAL | Status: DC

## 2013-02-11 MED ORDER — PENICILLIN G BENZATHINE 1200000 UNIT/2ML IM SUSP
1.2000 10*6.[IU] | Freq: Once | INTRAMUSCULAR | Status: AC
  Administered 2013-02-11: 1.2 10*6.[IU] via INTRAMUSCULAR

## 2013-02-11 NOTE — Progress Notes (Signed)
  Subjective:     Rebecca Dyer is a 22 y.o. female who presents for evaluation of sore throat. Associated symptoms include enlarged tonsils, pain while swallowing, sore throat and swollen glands. Onset of symptoms was 5 days ago, and have been gradually worsening since that time. She is drinking plenty of fluids. She has not had a recent close exposure to someone with proven streptococcal pharyngitis.  The following portions of the patient's history were reviewed and updated as appropriate: allergies, current medications, past family history, past medical history, past social history, past surgical history and problem list.  Review of Systems Pertinent items are noted in HPI.    Objective:    BP 100/60  Pulse 79  Temp(Src) 98.7 F (37.1 C) (Oral)  Wt 184 lb 12.8 oz (83.825 kg)  BMI 32.22 kg/m2  SpO2 98% General appearance: alert, cooperative, appears stated age and moderate distress Ears: normal TM's and external ear canals both ears Nose: Nares normal. Septum midline. Mucosa normal. No drainage or sinus tenderness. Throat: abnormal findings: moderate oropharyngeal erythema and tonsillar hypertrophy 2+ Neck: moderate anterior cervical adenopathy, supple, symmetrical, trachea midline and thyroid not enlarged, symmetric, no tenderness/mass/nodules Lungs: clear to auscultation bilaterally  Laboratory Strep test done. Results:negative.    Assessment:    Acute pharyngitis, likely  Bacterial tonsillitis R/O strep.    Plan:  Bicillin IM given  Patient placed on antibiotics. Use of OTC analgesics recommended as well as salt water gargles. Follow up as needed. pred taper--- pt instructed to go to ER if symptoms worsen instead of get better

## 2013-02-11 NOTE — Patient Instructions (Signed)

## 2013-02-13 LAB — CULTURE, GROUP A STREP

## 2013-05-18 ENCOUNTER — Telehealth: Payer: Self-pay | Admitting: *Deleted

## 2013-05-18 MED ORDER — MEDROXYPROGESTERONE ACETATE 150 MG/ML IM SUSP: 150.0000 mg | Freq: Once | INTRAMUSCULAR | Status: DC

## 2013-05-18 NOTE — Telephone Encounter (Signed)
Pt called requesting depo provera Rx sent to pharmacy [facility] on [address]. rx sent per TF note. Pt said her cycle started this week.

## 2013-05-21 ENCOUNTER — Ambulatory Visit (INDEPENDENT_AMBULATORY_CARE_PROVIDER_SITE_OTHER): Payer: BC Managed Care – PPO | Admitting: *Deleted

## 2013-05-21 DIAGNOSIS — Z3049 Encounter for surveillance of other contraceptives: Secondary | ICD-10-CM

## 2013-05-21 MED ORDER — MEDROXYPROGESTERONE ACETATE 150 MG/ML IM SUSP
150.0000 mg | Freq: Once | INTRAMUSCULAR | Status: AC
  Administered 2013-05-21: 150 mg via INTRAMUSCULAR

## 2013-05-21 NOTE — Progress Notes (Signed)
Pt was instructed to come in on her period to restart Depo Shot. The patient hasnt had a period until 05/17/13. Period previously was 12/10/12. Intercourse was over a month ago and she states she does use condoms. Next shot due Dec26 -Jan 9. Pt has annual set up for NOv 10.

## 2013-06-21 ENCOUNTER — Encounter: Payer: BC Managed Care – PPO | Admitting: Gynecology

## 2013-07-30 ENCOUNTER — Encounter: Payer: Self-pay | Admitting: Gynecology

## 2013-07-30 ENCOUNTER — Ambulatory Visit (INDEPENDENT_AMBULATORY_CARE_PROVIDER_SITE_OTHER): Payer: BC Managed Care – PPO | Admitting: Gynecology

## 2013-07-30 VITALS — BP 120/70 | Ht 63.5 in | Wt 169.0 lb

## 2013-07-30 DIAGNOSIS — Z01419 Encounter for gynecological examination (general) (routine) without abnormal findings: Secondary | ICD-10-CM

## 2013-07-30 DIAGNOSIS — N926 Irregular menstruation, unspecified: Secondary | ICD-10-CM

## 2013-07-30 LAB — CBC WITH DIFFERENTIAL/PLATELET
Basophils Absolute: 0 10*3/uL (ref 0.0–0.1)
Basophils Relative: 0 % (ref 0–1)
Eosinophils Absolute: 0.1 10*3/uL (ref 0.0–0.7)
HCT: 38.4 % (ref 36.0–46.0)
Hemoglobin: 12.9 g/dL (ref 12.0–15.0)
Lymphocytes Relative: 27 % (ref 12–46)
Lymphs Abs: 1.8 10*3/uL (ref 0.7–4.0)
MCH: 31.2 pg (ref 26.0–34.0)
MCHC: 33.6 g/dL (ref 30.0–36.0)
Monocytes Relative: 7 % (ref 3–12)
Neutro Abs: 4.3 10*3/uL (ref 1.7–7.7)
Neutrophils Relative %: 64 % (ref 43–77)
RBC: 4.13 MIL/uL (ref 3.87–5.11)
WBC: 6.7 10*3/uL (ref 4.0–10.5)

## 2013-07-30 LAB — HM PAP SMEAR

## 2013-07-30 MED ORDER — MEDROXYPROGESTERONE ACETATE 150 MG/ML IM SUSP
150.0000 mg | Freq: Once | INTRAMUSCULAR | Status: AC
  Administered 2013-07-30: 150 mg via INTRAMUSCULAR

## 2013-07-30 NOTE — Addendum Note (Signed)
Addended by: Dayna Barker on: 07/30/2013 02:59 PM   Modules accepted: Orders

## 2013-07-30 NOTE — Progress Notes (Signed)
Rebecca Dyer 1991-08-06 [id number]        22 y.o.  G0P0 for annual exam.  Several issues noted below.  Past medical history,surgical history, problem list, medications, allergies, family history and social history were all reviewed and documented in the EPIC chart.  ROS:  Performed and pertinent positives and negatives are included in the history, assessment and plan .  Exam: Kim assistant Filed Vitals:   07/30/13 1420  BP: 120/70  Height: 5' 3.5" (1.613 m)  Weight: 169 lb (76.658 kg)   General appearance  Normal Skin grossly normal Head/Neck normal with no cervical or supraclavicular adenopathy thyroid normal Lungs  clear Cardiac RR, without RMG Abdominal  soft, nontender, without masses, organomegaly or hernia Breasts  examined lying and sitting without masses, retractions, discharge or axillary adenopathy. Pelvic  Ext/BUS/vagina  Normal with light menses flow  Cervix  Normal with light menses flow  Uterus  anteverted, normal size, shape and contour, midline and mobile nontender   Adnexa  Without masses or tenderness    Anus and perineum  Normal       Assessment/Plan:  22 y.o. G0P0 female for annual exam.   1. Irregular menses. Patient has history of the past year or so of irregular menses. Had normal hormone levels to include TSH FSH and prolactin. Negative sonohysterogram and normal endometrial biopsy. Had been on Depo-Provera, low-dose oral contraceptives and nexplanon. Had been off of Depo-Provera since October 2013 but decided to reinitiate this past October he came in for a shot. She denies any possibility of pregnancy over the past year. Exam is normal today with light menstrual flow. Recommended she receive her second Depo-Provera shot now a month early to see if we can't stop her bleeding. She'll then resume the every 3 months shot. Assuming it regulates her then she will followup in one year otherwise followup sooner if irregular bleeding continues. 2. Pap smear. Will  initiate next year as she turns 23. 3. Breast health. SBE monthly reviewed. 4. STD screening offered and declined. 5. Gardasil series received. 6. Health maintenance. Baseline CBC ordered. Followup in one year, sooner as needed.   Note: This document was prepared with digital dictation and possible smart phrase technology. Any transcriptional errors that result from this process are unintentional.   Dara Lords MD, 2:40 PM 07/30/2013

## 2013-07-30 NOTE — Patient Instructions (Signed)
Followup if irregular bleeding continues otherwise followup in 1 year for annual exam.

## 2013-12-29 ENCOUNTER — Ambulatory Visit (INDEPENDENT_AMBULATORY_CARE_PROVIDER_SITE_OTHER): Payer: BC Managed Care – PPO | Admitting: Women's Health

## 2013-12-29 DIAGNOSIS — N760 Acute vaginitis: Secondary | ICD-10-CM

## 2013-12-29 DIAGNOSIS — N926 Irregular menstruation, unspecified: Secondary | ICD-10-CM

## 2013-12-29 DIAGNOSIS — B9689 Other specified bacterial agents as the cause of diseases classified elsewhere: Secondary | ICD-10-CM

## 2013-12-29 DIAGNOSIS — A499 Bacterial infection, unspecified: Secondary | ICD-10-CM

## 2013-12-29 DIAGNOSIS — Z113 Encounter for screening for infections with a predominantly sexual mode of transmission: Secondary | ICD-10-CM

## 2013-12-29 LAB — WET PREP FOR TRICH, YEAST, CLUE
Trich, Wet Prep: NONE SEEN
YEAST WET PREP: NONE SEEN

## 2013-12-29 LAB — HCG, SERUM, QUALITATIVE: Preg, Serum: NEGATIVE

## 2013-12-29 LAB — RPR

## 2013-12-29 MED ORDER — METRONIDAZOLE 0.75 % VA GEL: VAGINAL | Status: DC

## 2013-12-29 MED ORDER — MEDROXYPROGESTERONE ACETATE 10 MG PO TABS: 10.0000 mg | ORAL_TABLET | Freq: Every day | ORAL | Status: DC

## 2013-12-29 NOTE — Patient Instructions (Signed)
Bacterial Vaginosis Bacterial vaginosis is an infection of the vagina. It happens when too many of certain germs (bacteria) grow in the vagina. HOME CARE  Take your medicine as told by your doctor.  Finish your medicine even if you start to feel better.  Do not have sex until you finish your medicine and are better.  Tell your sex partner that you have an infection. They should see their doctor for treatment.  Practice safe sex. Use condoms. Have only one sex partner. GET HELP IF:  You are not getting better after 3 days of treatment.  You have more grey fluid (discharge) coming from your vagina than before.  You have more pain than before.  You have a fever. MAKE SURE YOU:   Understand these instructions.  Will watch your condition.  Will get help right away if you are not doing well or get worse. Document Released: 05/07/2008 Document Revised: 05/19/2013 Document Reviewed: 03/10/2013 Exit[employer] Patient Information 2014 [employer].  

## 2013-12-29 NOTE — Progress Notes (Signed)
Patient ID: Rebecca Dyer, female   DOB: 03-11-1991, 23 y.o.   MRN: [id number] Presents with complaint of irregular bleeding, states has been bleeding for greater than one month. History of irregular cycles in the past with normal TSH and prolactin. Has tried birth control pills and nexplanon in the past . New partner several months ago, not sexually active since January. Received Depo-Provera October 2014,  07/30/2013 and did not followup for third dose.   Exam: Appears well. External genitalia within normal limits, speculum exam moderate amount of menses type blood noted wet prep positive for amines, clues, and TNTC bacteria. Bimanual uterus is small nontender no adnexal fullness or tenderness. GC/Chlamydia culture taken.  Bacteria vaginosis Dysfunctional bleeding after Depo-Provera  Plan: Qualitative hCG, if negative withdrawal with Provera 10 for 10 days. Instructed to call if bleeding does not stop, will watch cycles and if regulate no further intervention. Return to office for contraception if needed, condoms encouraged. Unable to swallow pills, MetroGel vaginal cream 1 applicator at bedtime x5 after bleeding subsides, cough precautions reviewed. Instructed to call if no relief. HIV, hepatitis B, C., RPR.

## 2013-12-30 ENCOUNTER — Other Ambulatory Visit: Payer: Self-pay | Admitting: Women's Health

## 2013-12-30 DIAGNOSIS — B999 Unspecified infectious disease: Secondary | ICD-10-CM

## 2013-12-30 LAB — HIV ANTIBODY (ROUTINE TESTING W REFLEX): HIV: NONREACTIVE

## 2013-12-30 LAB — GC/CHLAMYDIA PROBE AMP
CT Probe RNA: POSITIVE — AB
GC Probe RNA: NEGATIVE

## 2013-12-30 LAB — HEPATITIS B SURFACE ANTIGEN: Hepatitis B Surface Ag: NEGATIVE

## 2013-12-30 LAB — HEPATITIS C ANTIBODY: HCV Ab: NEGATIVE

## 2013-12-30 MED ORDER — AZITHROMYCIN 500 MG PO TABS: 1000.0000 mg | ORAL_TABLET | Freq: Every day | ORAL | Status: DC

## 2014-01-18 ENCOUNTER — Ambulatory Visit (INDEPENDENT_AMBULATORY_CARE_PROVIDER_SITE_OTHER): Payer: BC Managed Care – PPO | Admitting: Women's Health

## 2014-01-18 ENCOUNTER — Encounter: Payer: Self-pay | Admitting: Women's Health

## 2014-01-18 DIAGNOSIS — N898 Other specified noninflammatory disorders of vagina: Secondary | ICD-10-CM

## 2014-01-18 DIAGNOSIS — B9689 Other specified bacterial agents as the cause of diseases classified elsewhere: Secondary | ICD-10-CM

## 2014-01-18 DIAGNOSIS — A499 Bacterial infection, unspecified: Secondary | ICD-10-CM

## 2014-01-18 DIAGNOSIS — N760 Acute vaginitis: Secondary | ICD-10-CM

## 2014-01-18 LAB — WET PREP FOR TRICH, YEAST, CLUE
Trich, Wet Prep: NONE SEEN
Yeast Wet Prep HPF POC: NONE SEEN

## 2014-01-18 MED ORDER — METRONIDAZOLE 0.75 % VA GEL: VAGINAL | Status: DC

## 2014-01-18 NOTE — Progress Notes (Signed)
Patient ID: Rebecca Dyer, female   DOB: 1991/03/21, 23 y.o.   MRN: [id number] Presents  for test of cure Chlamydia. Has abstained,  negative HIV, hepatitis and RPR. Ex- partner informed. Last Depo-Provera greater than 5 months ago. Had a negative blood pregnancy test 12/29/13 was given Provera 10 for 10 days, now only spotting.   Exam: Appears well. Abdomen soft nontender. External genitalia within normal limits, speculum exam scant menses type blood, GC/Chlamydia culture taken, wet prep positive for amines, clues, and TNTC bacteria.  Test of cure Chlamydia Bacteria vaginosis  Plan: Contraception options reviewed and declined. Ortho Evra patch, will call if would like to start. reviewed importance of condoms if sexually active. MetroGel vaginal cream 1 applicator at bedtime x5, alcohol precautions reviewed, instructed to call if no relief of spotting/discharge.

## 2014-01-19 ENCOUNTER — Other Ambulatory Visit: Payer: Self-pay | Admitting: Women's Health

## 2014-01-19 LAB — GC/CHLAMYDIA PROBE AMP
CT Probe RNA: POSITIVE — AB
GC Probe RNA: POSITIVE — AB

## 2014-01-19 MED ORDER — AZITHROMYCIN 500 MG PO TABS: 1000.0000 mg | ORAL_TABLET | Freq: Every day | ORAL | Status: DC

## 2014-01-19 MED ORDER — DOXYCYCLINE HYCLATE 100 MG PO CAPS: 100.0000 mg | ORAL_CAPSULE | Freq: Two times a day (BID) | ORAL | Status: DC

## 2014-01-19 MED ORDER — CEFIXIME 200 MG/5ML PO SUSR: ORAL | Status: DC

## 2014-01-20 ENCOUNTER — Other Ambulatory Visit: Payer: Self-pay | Admitting: Women's Health

## 2014-01-20 ENCOUNTER — Telehealth: Payer: Self-pay

## 2014-01-20 NOTE — Telephone Encounter (Signed)
Patient's mom called. She picked up Rx yesterday and Rebecca Dyer told her it is wrong Rx.  It was for Metrogel.  I confirmed with mom that you did prescribe that on 6/9 but then you spoke with patienton 6//10 and called in three other antibiotics.  Mom asked if any of those antibiotics came in a liquid as Rebecca Dyer has a hard time taking pills?  I told her if they did she would probably have lots of liquid to take as it might take several bottles to equal same dose at 14 tabs.  She said she would see what she could do getting her to take the pills and I told her to call if she had problems.  Do you want to prescribe liquid Rx's?

## 2014-01-20 NOTE — Telephone Encounter (Signed)
Left message with Rovena, Zithromax can be given in liguid, 1 gm to go pak, mix with water, and am not sure if doxy can be.

## 2014-01-21 NOTE — Telephone Encounter (Signed)
Message left

## 2014-02-10 ENCOUNTER — Ambulatory Visit: Payer: BC Managed Care – PPO | Admitting: Women's Health

## 2014-02-23 ENCOUNTER — Ambulatory Visit: Payer: BC Managed Care – PPO | Admitting: Women's Health

## 2014-08-26 ENCOUNTER — Encounter: Payer: BC Managed Care – PPO | Admitting: Women's Health

## 2015-03-28 ENCOUNTER — Ambulatory Visit (INDEPENDENT_AMBULATORY_CARE_PROVIDER_SITE_OTHER): Payer: BC Managed Care – PPO | Admitting: Internal Medicine

## 2015-03-28 ENCOUNTER — Encounter: Payer: Self-pay | Admitting: Internal Medicine

## 2015-03-28 ENCOUNTER — Other Ambulatory Visit: Payer: Self-pay

## 2015-03-28 VITALS — BP 102/66 | HR 65 | Temp 98.0°F | Ht 63.5 in | Wt 161.1 lb

## 2015-03-28 DIAGNOSIS — N912 Amenorrhea, unspecified: Secondary | ICD-10-CM

## 2015-03-28 DIAGNOSIS — T7840XA Allergy, unspecified, initial encounter: Secondary | ICD-10-CM | POA: Diagnosis not present

## 2015-03-28 LAB — POCT URINE PREGNANCY: Preg Test, Ur: NEGATIVE

## 2015-03-28 MED ORDER — PREDNISONE 5 MG/5ML PO SOLN: ORAL | Status: DC

## 2015-03-28 MED ORDER — DOXYCYCLINE MONOHYDRATE 25 MG/5ML PO SUSR: ORAL | Status: DC

## 2015-03-28 NOTE — Patient Instructions (Signed)
Take the medications as prescribed  Apply OTC hydrocortisone 1% cream at the area of the mosquito bite  Come back if you are not better in few days or if you get worse

## 2015-03-28 NOTE — Progress Notes (Signed)
Pre visit review using our clinic review tool, if applicable. No additional management support is needed unless otherwise documented below in the visit note. 

## 2015-03-28 NOTE — Progress Notes (Signed)
   Subjective:    Patient ID: Rebecca Dyer, female    DOB: 1991/04/11, 24 y.o.   MRN: [id number]  DOS:  03/28/2015 Type of visit - description : Acute Interval history: Had a mosquito bite last night, the area is swollen, tender. Historically, often times this type of reaction gets worse per pt.  Review of Systems Denies fever chills. No lip swelling or generalized itching.   Past Medical History  Diagnosis Date  . ADHD (attention deficit hyperactivity disorder)   . Depression     Past Surgical History  Procedure Laterality Date  . Umbilical hernia repair  1998    as a child  . Pyloric stenosis  1994    Social History   Social History  . Marital Status: Single    Spouse Name: N/A  . Number of Children: 0  . Years of Education: N/A   Occupational History  . works [employer]    Social History Main Topics  . Smoking status: Former Games developer  . Smokeless tobacco: Never Used  . Alcohol Use: 0.0 oz/week    0 Standard drinks or equivalent per week     Comment: socially  . Drug Use: No  . Sexual Activity: Not Currently    Birth Control/ Protection: Condom, Injection   Other Topics Concern  . Not on file   Social History Narrative   Sexual preference: females         Medication List       This list is accurate as of: 03/28/15 11:59 PM.  Always use your most recent med list.               doxycycline 25 MG/5ML Susr  Commonly known as:  VIBRAMYCIN  20 mls twice a day x 5 days     predniSONE 5 MG/5ML solution  50 mls x 1 day, 40 mls x 1 day, 30 mls x 1 day, 20 mls x 1 day, 10 mls x 1 day           Objective:   Physical Exam  Constitutional: She appears well-developed and well-nourished. No distress.  Musculoskeletal:       Legs: Skin: She is not diaphoretic.  Psychiatric: She has a normal mood and affect. Her behavior is normal. Judgment and thought content normal.   BP 102/66 mmHg  Pulse 65  Temp(Src) 98 F (36.7 C) (Oral)  Ht 5' 3.5" (1.613  m)  Wt 161 lb 2 oz (73.086 kg)  BMI 28.09 kg/m2  SpO2 99%     Assessment & Plan:   Allergic reaction to a mosquito bite, early cellulitis?. Plan: Antibiotics, prednisone. See instructions Allergic to [facility], states can't swallow pills, she can only take liquid medication. UPT negative Strongly recommend mosquito bites avoidance

## 2015-06-29 ENCOUNTER — Encounter: Payer: Self-pay | Admitting: Women's Health

## 2015-06-29 ENCOUNTER — Ambulatory Visit (INDEPENDENT_AMBULATORY_CARE_PROVIDER_SITE_OTHER): Payer: BC Managed Care – PPO | Admitting: Women's Health

## 2015-06-29 VITALS — BP 115/76 | Ht 64.0 in | Wt 159.8 lb

## 2015-06-29 DIAGNOSIS — Z113 Encounter for screening for infections with a predominantly sexual mode of transmission: Secondary | ICD-10-CM | POA: Diagnosis not present

## 2015-06-29 DIAGNOSIS — B9689 Other specified bacterial agents as the cause of diseases classified elsewhere: Secondary | ICD-10-CM

## 2015-06-29 DIAGNOSIS — Z01419 Encounter for gynecological examination (general) (routine) without abnormal findings: Secondary | ICD-10-CM

## 2015-06-29 DIAGNOSIS — A499 Bacterial infection, unspecified: Secondary | ICD-10-CM

## 2015-06-29 DIAGNOSIS — N760 Acute vaginitis: Secondary | ICD-10-CM

## 2015-06-29 LAB — CBC WITH DIFFERENTIAL/PLATELET
BASOS ABS: 0 10*3/uL (ref 0.0–0.1)
Basophils Relative: 0 % (ref 0–1)
EOS ABS: 0.1 10*3/uL (ref 0.0–0.7)
EOS PCT: 1 % (ref 0–5)
HEMATOCRIT: 39.6 % (ref 36.0–46.0)
Hemoglobin: 13.2 g/dL (ref 12.0–15.0)
LYMPHS ABS: 2.9 10*3/uL (ref 0.7–4.0)
LYMPHS PCT: 34 % (ref 12–46)
MCH: 31.7 pg (ref 26.0–34.0)
MCHC: 33.3 g/dL (ref 30.0–36.0)
MCV: 95 fL (ref 78.0–100.0)
MONO ABS: 0.3 10*3/uL (ref 0.1–1.0)
MONOS PCT: 4 % (ref 3–12)
MPV: 8.9 fL (ref 8.6–12.4)
Neutro Abs: 5.1 10*3/uL (ref 1.7–7.7)
Neutrophils Relative %: 61 % (ref 43–77)
PLATELETS: 262 10*3/uL (ref 150–400)
RBC: 4.17 MIL/uL (ref 3.87–5.11)
RDW: 12.7 % (ref 11.5–15.5)
WBC: 8.4 10*3/uL (ref 4.0–10.5)

## 2015-06-29 LAB — WET PREP FOR TRICH, YEAST, CLUE
Trich, Wet Prep: NONE SEEN
Yeast Wet Prep HPF POC: NONE SEEN

## 2015-06-29 LAB — HIV ANTIBODY (ROUTINE TESTING W REFLEX): HIV 1&2 Ab, 4th Generation: NONREACTIVE

## 2015-06-29 MED ORDER — METRONIDAZOLE 500 MG PO TABS: 500.0000 mg | ORAL_TABLET | Freq: Two times a day (BID) | ORAL | Status: DC

## 2015-06-29 NOTE — Patient Instructions (Signed)
Bacterial Vaginosis Bacterial vaginosis is a vaginal infection that occurs when the normal balance of bacteria in the vagina is disrupted. It results from an overgrowth of certain bacteria. This is the most common vaginal infection in women of childbearing age. Treatment is important to prevent complications, especially in pregnant women, as it can cause a premature delivery. CAUSES  Bacterial vaginosis is caused by an increase in harmful bacteria that are normally present in smaller amounts in the vagina. Several different kinds of bacteria can cause bacterial vaginosis. However, the reason that the condition develops is not fully understood. RISK FACTORS Certain activities or behaviors can put you at an increased risk of developing bacterial vaginosis, including:  Having a new sex partner or multiple sex partners.  Douching.  Using an intrauterine device (IUD) for contraception. Women do not get bacterial vaginosis from toilet seats, bedding, swimming pools, or contact with objects around them. SIGNS AND SYMPTOMS  Some women with bacterial vaginosis have no signs or symptoms. Common symptoms include:  Grey vaginal discharge.  A fishlike odor with discharge, especially after sexual intercourse.  Itching or burning of the vagina and vulva.  Burning or pain with urination. DIAGNOSIS  Your health care provider will take a medical history and examine the vagina for signs of bacterial vaginosis. A sample of vaginal fluid may be taken. Your health care provider will look at this sample under a microscope to check for bacteria and abnormal cells. A vaginal pH test may also be done.  TREATMENT  Bacterial vaginosis may be treated with antibiotic medicines. These may be given in the form of a pill or a vaginal cream. A second round of antibiotics may be prescribed if the condition comes back after treatment. Because bacterial vaginosis increases your risk for sexually transmitted diseases, getting  treated can help reduce your risk for chlamydia, gonorrhea, HIV, and herpes. HOME CARE INSTRUCTIONS   Only take over-the-counter or prescription medicines as directed by your health care provider.  If antibiotic medicine was prescribed, take it as directed. Make sure you finish it even if you start to feel better.  Tell all sexual partners that you have a vaginal infection. They should see their health care provider and be treated if they have problems, such as a mild rash or itching.  During treatment, it is important that you follow these instructions:  Avoid sexual activity or use condoms correctly.  Do not douche.  Avoid alcohol as directed by your health care provider.  Avoid breastfeeding as directed by your health care provider. SEEK MEDICAL CARE IF:   Your symptoms are not improving after 3 days of treatment.  You have increased discharge or pain.  You have a fever. MAKE SURE YOU:   Understand these instructions.  Will watch your condition.  Will get help right away if you are not doing well or get worse. FOR MORE INFORMATION  Centers for Disease Control and Prevention, Division of STD Prevention: [employer]raiser[employer]raud.[employer] American Sexual Health Association (ASHA): www.ashastd.org    This information is not intended to replace advice given to you by your health care provider. Make sure you discuss any questions you have with your health care provider.   Document Released: 07/29/2005 Document Revised: 08/19/2014 Document Reviewed: 03/10/2013 Elsevier Interactive Patient Education 2016 [place] Maintenance, Female Adopting a healthy lifestyle and getting preventive care can go a long way to promote health and wellness. Talk with your health care provider about what schedule of regular examinations is right for you.  This is a good chance for you to check in with your provider about disease prevention and staying healthy. In between checkups, there are plenty of  things you can do on your own. Experts have done a lot of research about which lifestyle changes and preventive measures are most likely to keep you healthy. Ask your health care provider for more information. WEIGHT AND DIET  Eat a healthy diet  Be sure to include plenty of vegetables, fruits, low-fat dairy products, and lean protein.  Do not eat a lot of foods high in solid fats, added sugars, or salt.  Get regular exercise. This is one of the most important things you can do for your health.  Most adults should exercise for at least 150 minutes each week. The exercise should increase your heart rate and make you sweat (moderate-intensity exercise).  Most adults should also do strengthening exercises at least twice a week. This is in addition to the moderate-intensity exercise.  Maintain a healthy weight  Body mass index (BMI) is a measurement that can be used to identify possible weight problems. It estimates body fat based on height and weight. Your health care provider can help determine your BMI and help you achieve or maintain a healthy weight.  For females 58 years of age and older:   A BMI below 18.5 is considered underweight.  A BMI of 18.5 to 24.9 is normal.  A BMI of 25 to 29.9 is considered overweight.  A BMI of 30 and above is considered obese.  Watch levels of cholesterol and blood lipids  You should start having your blood tested for lipids and cholesterol at 22 years of age, then have this test every 5 years.  You may need to have your cholesterol levels checked more often if:  Your lipid or cholesterol levels are high.  You are older than 22 years of age.  You are at high risk for heart disease.  CANCER SCREENING   Lung Cancer  Lung cancer screening is recommended for adults 62-77 years old who are at high risk for lung cancer because of a history of smoking.  A yearly low-dose CT scan of the lungs is recommended for people who:  Currently  smoke.  Have quit within the past 15 years.  Have at least a 30-pack-year history of smoking. A pack year is smoking an average of one pack of cigarettes a day for 1 year.  Yearly screening should continue until it has been 15 years since you quit.  Yearly screening should stop if you develop a health problem that would prevent you from having lung cancer treatment.  Breast Cancer  Practice breast self-awareness. This means understanding how your breasts normally appear and feel.  It also means doing regular breast self-exams. Let your health care provider know about any changes, no matter how small.  If you are in your 20s or 30s, you should have a clinical breast exam (CBE) by a health care provider every 1-3 years as part of a regular health exam.  If you are 33 or older, have a CBE every year. Also consider having a breast X-ray (mammogram) every year.  If you have a family history of breast cancer, talk to your health care provider about genetic screening.  If you are at high risk for breast cancer, talk to your health care provider about having an MRI and a mammogram every year.  Breast cancer gene (BRCA) assessment is recommended for women who have family members  with BRCA-related cancers. BRCA-related cancers include:  Breast.  Ovarian.  Tubal.  Peritoneal cancers.  Results of the assessment will determine the need for genetic counseling and BRCA1 and BRCA2 testing. Cervical Cancer Your health care provider may recommend that you be screened regularly for cancer of the pelvic organs (ovaries, uterus, and vagina). This screening involves a pelvic examination, including checking for microscopic changes to the surface of your cervix (Pap test). You may be encouraged to have this screening done every 3 years, beginning at age 13.  For women ages 57-65, health care providers may recommend pelvic exams and Pap testing every 3 years, or they may recommend the Pap and pelvic  exam, combined with testing for human papilloma virus (HPV), every 5 years. Some types of HPV increase your risk of cervical cancer. Testing for HPV may also be done on women of any age with unclear Pap test results.  Other health care providers may not recommend any screening for nonpregnant women who are considered low risk for pelvic cancer and who do not have symptoms. Ask your health care provider if a screening pelvic exam is right for you.  If you have had past treatment for cervical cancer or a condition that could lead to cancer, you need Pap tests and screening for cancer for at least 20 years after your treatment. If Pap tests have been discontinued, your risk factors (such as having a new sexual partner) need to be reassessed to determine if screening should resume. Some women have medical problems that increase the chance of getting cervical cancer. In these cases, your health care provider may recommend more frequent screening and Pap tests. Colorectal Cancer  This type of cancer can be detected and often prevented.  Routine colorectal cancer screening usually begins at 22 years of age and continues through 22 years of age.  Your health care provider may recommend screening at an earlier age if you have risk factors for colon cancer.  Your health care provider may also recommend using home test kits to check for hidden blood in the stool.  A small camera at the end of a tube can be used to examine your colon directly (sigmoidoscopy or colonoscopy). This is done to check for the earliest forms of colorectal cancer.  Routine screening usually begins at age 71.  Direct examination of the colon should be repeated every 5-10 years through 22 years of age. However, you may need to be screened more often if early forms of precancerous polyps or small growths are found. Skin Cancer  Check your skin from head to toe regularly.  Tell your health care provider about any new moles or  changes in moles, especially if there is a change in a mole's shape or color.  Also tell your health care provider if you have a mole that is larger than the size of a pencil eraser.  Always use sunscreen. Apply sunscreen liberally and repeatedly throughout the day.  Protect yourself by wearing long sleeves, pants, a wide-brimmed hat, and sunglasses whenever you are outside. HEART DISEASE, DIABETES, AND HIGH BLOOD PRESSURE   High blood pressure causes heart disease and increases the risk of stroke. High blood pressure is more likely to develop in:  People who have blood pressure in the high end of the normal range (130-139/85-89 mm Hg).  People who are overweight or obese.  People who are African American.  If you are 53-74 years of age, have your blood pressure checked every 3-5 years.  If you are 32 years of age or older, have your blood pressure checked every year. You should have your blood pressure measured twice--once when you are at a hospital or clinic, and once when you are not at a hospital or clinic. Record the average of the two measurements. To check your blood pressure when you are not at a hospital or clinic, you can use:  An automated blood pressure machine at a pharmacy.  A home blood pressure monitor.  If you are between 74 years and 8 years old, ask your health care provider if you should take aspirin to prevent strokes.  Have regular diabetes screenings. This involves taking a blood sample to check your fasting blood sugar level.  If you are at a normal weight and have a low risk for diabetes, have this test once every three years after 22 years of age.  If you are overweight and have a high risk for diabetes, consider being tested at a younger age or more often. PREVENTING INFECTION  Hepatitis B  If you have a higher risk for hepatitis B, you should be screened for this virus. You are considered at high risk for hepatitis B if:  You were born in a country where  hepatitis B is common. Ask your health care provider which countries are considered high risk.  Your parents were born in a high-risk country, and you have not been immunized against hepatitis B (hepatitis B vaccine).  You have HIV or AIDS.  You use needles to inject street drugs.  You live with someone who has hepatitis B.  You have had sex with someone who has hepatitis B.  You get hemodialysis treatment.  You take certain medicines for conditions, including cancer, organ transplantation, and autoimmune conditions. Hepatitis C  Blood testing is recommended for:  Everyone born from [birth year] through 1963.  Anyone with known risk factors for hepatitis C. Sexually transmitted infections (STIs)  You should be screened for sexually transmitted infections (STIs) including gonorrhea and chlamydia if:  You are sexually active and are younger than 22 years of age.  You are older than 22 years of age and your health care provider tells you that you are at risk for this type of infection.  Your sexual activity has changed since you were last screened and you are at an increased risk for chlamydia or gonorrhea. Ask your health care provider if you are at risk.  If you do not have HIV, but are at risk, it may be recommended that you take a prescription medicine daily to prevent HIV infection. This is called pre-exposure prophylaxis (PrEP). You are considered at risk if:  You are sexually active and do not regularly use condoms or know the HIV status of your partner(s).  You take drugs by injection.  You are sexually active with a partner who has HIV. Talk with your health care provider about whether you are at high risk of being infected with HIV. If you choose to begin PrEP, you should first be tested for HIV. You should then be tested every 3 months for as long as you are taking PrEP.  PREGNANCY   If you are premenopausal and you may become pregnant, ask your health care provider about  preconception counseling.  If you may become pregnant, take 400 to 800 micrograms (mcg) of folic acid every day.  If you want to prevent pregnancy, talk to your health care provider about birth control (contraception). OSTEOPOROSIS AND MENOPAUSE   Osteoporosis  is a disease in which the bones lose minerals and strength with aging. This can result in serious bone fractures. Your risk for osteoporosis can be identified using a bone density scan.  If you are 55 years of age or older, or if you are at risk for osteoporosis and fractures, ask your health care provider if you should be screened.  Ask your health care provider whether you should take a calcium or vitamin D supplement to lower your risk for osteoporosis.  Menopause may have certain physical symptoms and risks.  Hormone replacement therapy may reduce some of these symptoms and risks. Talk to your health care provider about whether hormone replacement therapy is right for you.  HOME CARE INSTRUCTIONS   Schedule regular health, dental, and eye exams.  Stay current with your immunizations.   Do not use any tobacco products including cigarettes, chewing tobacco, or electronic cigarettes.  If you are pregnant, do not drink alcohol.  If you are breastfeeding, limit how much and how often you drink alcohol.  Limit alcohol intake to no more than 1 drink per day for nonpregnant women. One drink equals 12 ounces of beer, 5 ounces of wine, or 1 ounces of hard liquor.  Do not use street drugs.  Do not share needles.  Ask your health care provider for help if you need support or information about quitting drugs.  Tell your health care provider if you often feel depressed.  Tell your health care provider if you have ever been abused or do not feel safe at home.   This information is not intended to replace advice given to you by your health care provider. Make sure you discuss any questions you have with your health care  provider.   Document Released: 02/11/2011 Document Revised: 08/19/2014 Document Reviewed: 06/30/2013 Elsevier Interactive Patient Education [employer].

## 2015-06-29 NOTE — Progress Notes (Signed)
Rebecca Dyer 28-Jun-1991 [id number]    History:    Presents for annual exam.  Cycles every 1-2 months. Currently in a lesbian relationship. Last time intercourse with female was in April with a condom. Positive chlamydia 2015 with negative test of cure. Had been on Ortho Evra patch but did stop. Normal Pap history. Gardasil series completed.  Past medical history, past surgical history, family history and social history were all reviewed and documented in the EPIC chart. Working at [employer], contemplating being a IT sales professionalfirefighter. Biological parents history of drug abuse.   ROS:  A ROS was performed and pertinent positives and negatives are included.  Exam:  Filed Vitals:   06/29/15 1603  BP: 115/76    General appearance:  Normal Thyroid:  Symmetrical, normal in size, without palpable masses or nodularity. Respiratory  Auscultation:  Clear without wheezing or rhonchi Cardiovascular  Auscultation:  Regular rate, without rubs, murmurs or gallops  Edema/varicosities:  Not grossly evident Abdominal  Soft,nontender, without masses, guarding or rebound.  Liver/spleen:  No organomegaly noted  Hernia:  None appreciated  Skin  Inspection:  Grossly normal   Breasts: Examined lying and sitting.     Right: Without masses, retractions, discharge or axillary adenopathy.     Left: Without masses, retractions, discharge or axillary adenopathy. Gentitourinary   Inguinal/mons:  Normal without inguinal adenopathy  External genitalia:  Normal  BUS/Urethra/Skene's glands:  Normal  Vagina:  Pink discharge with odor wet prep positive for amines, clues, TNTC bacteria  Cervix:  Normal  Uterus:  normal in size, shape and contour.  Midline and mobile  Adnexa/parametria:     Rt: Without masses or tenderness.   Lt: Without masses or tenderness.  Anus and perineum: Normal   Assessment/Plan:  24 y.o. SBF G0 for annual exam.    Cycles every 1-2 months for 5 days/lesbian relationship BV STD screen History  of depression-denies need for counseling at this time  Plan: Denies need for contraception. Flagyl 500 twice daily for 7 days #14 alcohol precautions reviewed with refill for partner. SBE's, regular exercise, calcium rich diet, MVI daily encouraged. Campus safety reviewed. CBC, UA, GC/Chlamydia, HIV, hep B, C, RPRHarrington Challenger.    Rebecca Dyer [facility] IncWHNP, 4:57 PM 06/29/2015

## 2015-06-30 LAB — GC/CHLAMYDIA PROBE AMP
CT Probe RNA: NEGATIVE
GC Probe RNA: NEGATIVE

## 2015-06-30 LAB — RPR

## 2015-06-30 LAB — HEPATITIS C ANTIBODY: HCV Ab: NEGATIVE

## 2015-06-30 LAB — HEPATITIS B SURFACE ANTIGEN: Hepatitis B Surface Ag: NEGATIVE

## 2015-07-20 ENCOUNTER — Telehealth: Payer: Self-pay | Admitting: Internal Medicine

## 2015-07-20 NOTE — Telephone Encounter (Signed)
Called pt in regard to flu shot and she hung up the phone.

## 2016-06-15 ENCOUNTER — Emergency Department (HOSPITAL_COMMUNITY): Payer: BC Managed Care – PPO

## 2016-06-15 ENCOUNTER — Encounter (HOSPITAL_COMMUNITY): Payer: Self-pay

## 2016-06-15 ENCOUNTER — Emergency Department (HOSPITAL_COMMUNITY)
Admission: EM | Admit: 2016-06-15 | Discharge: 2016-06-15 | Disposition: A | Discharge status: Home / Self Care | Payer: BC Managed Care – PPO | Attending: Emergency Medicine | Admitting: Emergency Medicine

## 2016-06-15 DIAGNOSIS — M436 Torticollis: Secondary | ICD-10-CM | POA: Insufficient documentation

## 2016-06-15 DIAGNOSIS — Z87891 Personal history of nicotine dependence: Secondary | ICD-10-CM | POA: Insufficient documentation

## 2016-06-15 DIAGNOSIS — F909 Attention-deficit hyperactivity disorder, unspecified type: Secondary | ICD-10-CM | POA: Diagnosis not present

## 2016-06-15 MED ORDER — CYCLOBENZAPRINE HCL 10 MG PO TABS
5.0000 mg | ORAL_TABLET | Freq: Once | ORAL | Status: AC
  Administered 2016-06-15: 5 mg via ORAL
  Filled 2016-06-15: qty 1

## 2016-06-15 MED ORDER — KETOROLAC TROMETHAMINE 30 MG/ML IJ SOLN
60.0000 mg | Freq: Once | INTRAMUSCULAR | Status: AC
Start: 2016-06-15 — End: 2016-06-15
  Administered 2016-06-15: 60 mg via INTRAMUSCULAR
  Filled 2016-06-15: qty 2

## 2016-06-15 MED ORDER — CYCLOBENZAPRINE HCL 5 MG PO TABS: 5.0000 mg | ORAL_TABLET | Freq: Two times a day (BID) | ORAL | 0 refills | Status: DC | PRN

## 2016-06-15 NOTE — ED Provider Notes (Signed)
[facility]-EMERGENCY DEPT Provider Note   CSN: [id number] Arrival date & time: 06/15/16  0818     History   Chief Complaint Chief Complaint  Patient presents with  . Torticollis    HPI Rebecca Dyer is a 25 y.o. female.  The history is provided by the patient. No language interpreter was used.   Rebecca Dyer is a 25 y.o. female who presents to the Emergency Department complaining of neck pain.  At 6 AM this morning she developed pain in her right shoulder up to her right lateral neck. She did not wake up with the pain but noticed it when she was getting ready to leave the house. The pain is worse with rotation of the neck and deep breaths. She denies any fevers, shortness of breath, recent injuries, bowel pain, vomiting, diarrhea. She does feel some tingling in her right arm. No prior similar symptoms. She does work 2 jobs and uses her right arm a lot. She is right-handed. Past Medical History:  Diagnosis Date  . ADHD (attention deficit hyperactivity disorder)   . Depression     Patient Active Problem List   Diagnosis Date Noted  . ADHD (attention deficit hyperactivity disorder) 09/11/2012  . Depression 09/11/2012  . Major depressive disorder, recurrent episode, in partial or unspecified remission 05/14/2012  . Pyloric stenosis 04/16/2011    Past Surgical History:  Procedure Laterality Date  . pyloric stenosis  1994  . UMBILICAL HERNIA REPAIR  1998   as a child    OB History    Gravida Para Term Preterm AB Living   0 0           SAB TAB Ectopic Multiple Live Births                   Home Medications    Prior to Admission medications   Medication Sig Start Date End Date Taking? Authorizing Provider  cyclobenzaprine (FLEXERIL) 5 MG tablet Take 1-2 tablets (5-10 mg total) by mouth 2 (two) times daily as needed for muscle spasms. 06/15/16   Tilden FossaElizabeth Ramesha Poster, MD  metroNIDAZOLE (FLAGYL) 500 MG tablet Take 1 tablet (500 mg total) by mouth 2 (two) times daily. 06/29/15   Harrington ChallengerNancy  J Young, NP  predniSONE 5 MG/5ML solution 50 mls x 1 day, 40 mls x 1 day, 30 mls x 1 day, 20 mls x 1 day, 10 mls x 1 day Patient not taking: Reported on 06/29/2015 03/28/15   Wanda PlumpJose E Paz, MD    Family History Family History  Problem Relation Age of Onset  . Adopted: Yes  . Breast cancer Maternal Aunt     age unknown  . Drug abuse Mother   . Drug abuse Father     Social History Social History  Substance Use Topics  . Smoking status: Former Games developermoker  . Smokeless tobacco: Never Used  . Alcohol use 0.0 oz/week     Comment: socially     Allergies   Duricef [cefadroxil monohydrate]   Review of Systems Review of Systems  All other systems reviewed and are negative.    Physical Exam Updated Vital Signs BP 120/66 (BP Location: Left Arm)   Pulse (!) 56   Temp 98.4 F (36.9 C) (Oral)   Resp 18   Ht 5\' 3"  (1.6 m)   Wt 160 lb (72.6 kg)   LMP 05/29/2016 (Approximate)   SpO2 100%   BMI 28.34 kg/m   Physical Exam  Constitutional: She is oriented to person, place, and  time. She appears well-developed and well-nourished.  HENT:  Head: Normocephalic and atraumatic.  Nose: Nose normal.  Mouth/Throat: Oropharynx is clear and moist. No oropharyngeal exudate.  Eyes: EOM are normal.  Neck:  Pain on rotation of the neck to the left and right with decreased range of motion.  Cardiovascular: Normal rate and regular rhythm.   No murmur heard. Pulmonary/Chest: Effort normal. No respiratory distress.  Decreased air movement bilaterally secondary to shallow respirations  Musculoskeletal: She exhibits no edema.  There is tenderness to palpation over the right lateral neck, right anterior upper chest wall and right posterior upper chest wall and right shoulder. 2+ radial pulses bilaterally. No rashes or palpable masses.  Lymphadenopathy:    She has no cervical adenopathy.  Neurological: She is alert and oriented to person, place, and time.  5 out of 5 grip strength bilaterally,  sensation to light touch intact in bilateral upper extremities.  Skin: Skin is warm and dry.  Psychiatric: She has a normal mood and affect. Her behavior is normal.  Nursing note and vitals reviewed.    ED Treatments / Results  Labs (all labs ordered are listed, but only abnormal results are displayed) Labs Reviewed - No data to display  EKG  EKG Interpretation None       Radiology Dg Chest 2 View  Result Date: 06/15/2016 CLINICAL DATA:  Right-sided chest pain EXAM: CHEST  2 VIEW COMPARISON:  None. FINDINGS: The heart size and mediastinal contours are within normal limits. Both lungs are clear. The visualized skeletal structures are unremarkable. IMPRESSION: No active cardiopulmonary disease. Electronically Signed   By: Alcide CleverMark  Lukens M.D.   On: 06/15/2016 09:45    Procedures Procedures (including critical care time)  Medications Ordered in ED Medications  ketorolac (TORADOL) 30 MG/ML injection 60 mg (not administered)  cyclobenzaprine (FLEXERIL) tablet 5 mg (not administered)     Initial Impression / Assessment and Plan / ED Course  I have reviewed the triage vital signs and the nursing notes.  Pertinent labs & imaging results that were available during my care of the patient were reviewed by me and considered in my medical decision making (see chart for details).  Clinical Course    Patient here for evaluation of right lateral neck pain, nontraumatic. No evidence of acute infectious process. Presentation is consistent with torticollis. Discussed home care with NSAIDs, muscle relaxants. Discussed outpatient follow-up and return precautions.  Final Clinical Impressions(s) / ED Diagnoses   Final diagnoses:  Torticollis, acute    New Prescriptions New Prescriptions   CYCLOBENZAPRINE (FLEXERIL) 5 MG TABLET    Take 1-2 tablets (5-10 mg total) by mouth 2 (two) times daily as needed for muscle spasms.     Tilden FossaElizabeth Canaan Prue, MD 06/15/16 1003

## 2016-06-15 NOTE — ED Triage Notes (Signed)
She states she awoke this morning with left-sided non-traumatic neck pain and stiffness. She denies fever, nor any other sign of current illness. She cites much lifting/bending at "my two jobs".

## 2016-07-02 ENCOUNTER — Ambulatory Visit (INDEPENDENT_AMBULATORY_CARE_PROVIDER_SITE_OTHER): Payer: BC Managed Care – PPO | Admitting: Women's Health

## 2016-07-02 ENCOUNTER — Encounter: Payer: Self-pay | Admitting: Women's Health

## 2016-07-02 VITALS — BP 110/78 | Ht 64.0 in | Wt 150.0 lb

## 2016-07-02 DIAGNOSIS — Z01419 Encounter for gynecological examination (general) (routine) without abnormal findings: Secondary | ICD-10-CM

## 2016-07-02 DIAGNOSIS — N760 Acute vaginitis: Secondary | ICD-10-CM

## 2016-07-02 DIAGNOSIS — N898 Other specified noninflammatory disorders of vagina: Secondary | ICD-10-CM

## 2016-07-02 DIAGNOSIS — B9689 Other specified bacterial agents as the cause of diseases classified elsewhere: Secondary | ICD-10-CM

## 2016-07-02 DIAGNOSIS — N926 Irregular menstruation, unspecified: Secondary | ICD-10-CM | POA: Diagnosis not present

## 2016-07-02 LAB — CBC WITH DIFFERENTIAL/PLATELET
BASOS ABS: 0 {cells}/uL (ref 0–200)
Basophils Relative: 0 %
EOS ABS: 61 {cells}/uL (ref 15–500)
EOS PCT: 1 %
HCT: 38.4 % (ref 35.0–45.0)
Hemoglobin: 12.7 g/dL (ref 11.7–15.5)
LYMPHS PCT: 45 %
Lymphs Abs: 2745 cells/uL (ref 850–3900)
MCH: 31.6 pg (ref 27.0–33.0)
MCHC: 33.1 g/dL (ref 32.0–36.0)
MCV: 95.5 fL (ref 80.0–100.0)
MONOS PCT: 6 %
MPV: 9 fL (ref 7.5–12.5)
Monocytes Absolute: 366 cells/uL (ref 200–950)
NEUTROS PCT: 48 %
Neutro Abs: 2928 cells/uL (ref 1500–7800)
PLATELETS: 272 10*3/uL (ref 140–400)
RBC: 4.02 MIL/uL (ref 3.80–5.10)
RDW: 12.7 % (ref 11.0–15.0)
WBC: 6.1 10*3/uL (ref 3.8–10.8)

## 2016-07-02 LAB — TSH: TSH: 1.2 m[IU]/L

## 2016-07-02 LAB — WET PREP FOR TRICH, YEAST, CLUE
TRICH WET PREP: NONE SEEN
YEAST WET PREP: NONE SEEN

## 2016-07-02 MED ORDER — METRONIDAZOLE 0.75 % VA GEL: VAGINAL | 1 refills | Status: DC

## 2016-07-02 MED ORDER — METRONIDAZOLE 500 MG PO TABS: 500.0000 mg | ORAL_TABLET | Freq: Two times a day (BID) | ORAL | 1 refills | Status: DC

## 2016-07-02 NOTE — Patient Instructions (Signed)
Bacterial Vaginosis Bacterial vaginosis is an infection of the vagina. It happens when too many germs (bacteria) grow in the vagina. This infection puts you at risk for infections from sex (STIs). Treating this infection can lower your risk for some STIs. You should also treat this if you are pregnant. It can cause your baby to be born early. Follow these instructions at home: Medicines  Take over-the-counter and prescription medicines only as told by your doctor.  Take or use your antibiotic medicine as told by your doctor. Do not stop taking or using it even if you start to feel better. General instructions  If you your sexual partner is a woman, tell her that you have this infection. She needs to get treatment if she has symptoms. If you have a female partner, he does not need to be treated.  During treatment:  Avoid sex.  Do not douche.  Avoid alcohol as told.  Avoid breastfeeding as told.  Drink enough fluid to keep your pee (urine) clear or pale yellow.  Keep your vagina and butt (rectum) clean.  Wash the area with warm water every day.  Wipe from front to back after you use the toilet.  Keep all follow-up visits as told by your doctor. This is important. Preventing this condition  Do not douche.  Use only warm water to wash around your vagina.  Use protection when you have sex. This includes:  Latex condoms.  Dental dams.  Limit how many people you have sex with. It is best to only have sex with the same person (be monogamous).  Get tested for STIs. Have your partner get tested.  Wear underwear that is cotton or lined with cotton.  Avoid tight pants and pantyhose. This is most important in summer.  Do not use any products that have nicotine or tobacco in them. These include cigarettes and e-cigarettes. If you need help quitting, ask your doctor.  Do not use illegal drugs.  Limit how much alcohol you drink. Contact a doctor if:  Your symptoms do not get  better, even after you are treated.  You have more discharge or pain when you pee (urinate).  You have a fever.  You have pain in your belly (abdomen).  You have pain with sex.  Your bleed from your vagina between periods. Summary  This infection happens when too many germs (bacteria) grow in the vagina.  Treating this condition can lower your risk for some infections from sex (STIs).  You should also treat this if you are pregnant. It can cause early (premature) birth.  Do not stop taking or using your antibiotic medicine even if you start to feel better. This information is not intended to replace advice given to you by your health care provider. Make sure you discuss any questions you have with your health care provider. Document Released: 05/07/2008 Document Revised: 04/13/2016 Document Reviewed: 04/13/2016 Elsevier Interactive Patient Education  2017 [employer] Maintenance, Female Introduction Adopting a healthy lifestyle and getting preventive care can go a long way to promote health and wellness. Talk with your health care provider about what schedule of regular examinations is right for you. This is a good chance for you to check in with your provider about disease prevention and staying healthy. In between checkups, there are plenty of things you can do on your own. Experts have done a lot of research about which lifestyle changes and preventive measures are most likely to keep you healthy. Ask your health  care provider for more information. Weight and diet Eat a healthy diet  Be sure to include plenty of vegetables, fruits, low-fat dairy products, and lean protein.  Do not eat a lot of foods high in solid fats, added sugars, or salt.  Get regular exercise. This is one of the most important things you can do for your health.  Most adults should exercise for at least 150 minutes each week. The exercise should increase your heart rate and make you sweat  (moderate-intensity exercise).  Most adults should also do strengthening exercises at least twice a week. This is in addition to the moderate-intensity exercise. Maintain a healthy weight  Body mass index (BMI) is a measurement that can be used to identify possible weight problems. It estimates body fat based on height and weight. Your health care provider can help determine your BMI and help you achieve or maintain a healthy weight.  For females 57 years of age and older:  A BMI below 18.5 is considered underweight.  A BMI of 18.5 to 24.9 is normal.  A BMI of 25 to 29.9 is considered overweight.  A BMI of 30 and above is considered obese. Watch levels of cholesterol and blood lipids  You should start having your blood tested for lipids and cholesterol at 23 years of age, then have this test every 5 years.  You may need to have your cholesterol levels checked more often if:  Your lipid or cholesterol levels are high.  You are older than 23 years of age.  You are at high risk for heart disease. Cancer screening Lung Cancer  Lung cancer screening is recommended for adults 92-50 years old who are at high risk for lung cancer because of a history of smoking.  A yearly low-dose CT scan of the lungs is recommended for people who:  Currently smoke.  Have quit within the past 15 years.  Have at least a 30-pack-year history of smoking. A pack year is smoking an average of one pack of cigarettes a day for 1 year.  Yearly screening should continue until it has been 15 years since you quit.  Yearly screening should stop if you develop a health problem that would prevent you from having lung cancer treatment. Breast Cancer  Practice breast self-awareness. This means understanding how your breasts normally appear and feel.  It also means doing regular breast self-exams. Let your health care provider know about any changes, no matter how small.  If you are in your 20s or 30s, you  should have a clinical breast exam (CBE) by a health care provider every 1-3 years as part of a regular health exam.  If you are 77 or older, have a CBE every year. Also consider having a breast X-ray (mammogram) every year.  If you have a family history of breast cancer, talk to your health care provider about genetic screening.  If you are at high risk for breast cancer, talk to your health care provider about having an MRI and a mammogram every year.  Breast cancer gene (BRCA) assessment is recommended for women who have family members with BRCA-related cancers. BRCA-related cancers include:  Breast.  Ovarian.  Tubal.  Peritoneal cancers.  Results of the assessment will determine the need for genetic counseling and BRCA1 and BRCA2 testing. Cervical Cancer  Your health care provider may recommend that you be screened regularly for cancer of the pelvic organs (ovaries, uterus, and vagina). This screening involves a pelvic examination, including checking for  microscopic changes to the surface of your cervix (Pap test). You may be encouraged to have this screening done every 3 years, beginning at age 46.  For women ages 13-65, health care providers may recommend pelvic exams and Pap testing every 3 years, or they may recommend the Pap and pelvic exam, combined with testing for human papilloma virus (HPV), every 5 years. Some types of HPV increase your risk of cervical cancer. Testing for HPV may also be done on women of any age with unclear Pap test results.  Other health care providers may not recommend any screening for nonpregnant women who are considered low risk for pelvic cancer and who do not have symptoms. Ask your health care provider if a screening pelvic exam is right for you.  If you have had past treatment for cervical cancer or a condition that could lead to cancer, you need Pap tests and screening for cancer for at least 20 years after your treatment. If Pap tests have been  discontinued, your risk factors (such as having a new sexual partner) need to be reassessed to determine if screening should resume. Some women have medical problems that increase the chance of getting cervical cancer. In these cases, your health care provider may recommend more frequent screening and Pap tests. Colorectal Cancer  This type of cancer can be detected and often prevented.  Routine colorectal cancer screening usually begins at 23 years of age and continues through 23 years of age.  Your health care provider may recommend screening at an earlier age if you have risk factors for colon cancer.  Your health care provider may also recommend using home test kits to check for hidden blood in the stool.  A small camera at the end of a tube can be used to examine your colon directly (sigmoidoscopy or colonoscopy). This is done to check for the earliest forms of colorectal cancer.  Routine screening usually begins at age 63.  Direct examination of the colon should be repeated every 5-10 years through 23 years of age. However, you may need to be screened more often if early forms of precancerous polyps or small growths are found. Skin Cancer  Check your skin from head to toe regularly.  Tell your health care provider about any new moles or changes in moles, especially if there is a change in a mole's shape or color.  Also tell your health care provider if you have a mole that is larger than the size of a pencil eraser.  Always use sunscreen. Apply sunscreen liberally and repeatedly throughout the day.  Protect yourself by wearing long sleeves, pants, a wide-brimmed hat, and sunglasses whenever you are outside. Heart disease, diabetes, and high blood pressure  High blood pressure causes heart disease and increases the risk of stroke. High blood pressure is more likely to develop in:  People who have blood pressure in the high end of the normal range (130-139/85-89 mm Hg).  People  who are overweight or obese.  People who are African American.  If you are 62-93 years of age, have your blood pressure checked every 3-5 years. If you are 87 years of age or older, have your blood pressure checked every year. You should have your blood pressure measured twice-once when you are at a hospital or clinic, and once when you are not at a hospital or clinic. Record the average of the two measurements. To check your blood pressure when you are not at a hospital or clinic, you  can use:  An automated blood pressure machine at a pharmacy.  A home blood pressure monitor.  If you are between 84 years and 17 years old, ask your health care provider if you should take aspirin to prevent strokes.  Have regular diabetes screenings. This involves taking a blood sample to check your fasting blood sugar level.  If you are at a normal weight and have a low risk for diabetes, have this test once every three years after 23 years of age.  If you are overweight and have a high risk for diabetes, consider being tested at a younger age or more often. Preventing infection Hepatitis B  If you have a higher risk for hepatitis B, you should be screened for this virus. You are considered at high risk for hepatitis B if:  You were born in a country where hepatitis B is common. Ask your health care provider which countries are considered high risk.  Your parents were born in a high-risk country, and you have not been immunized against hepatitis B (hepatitis B vaccine).  You have HIV or AIDS.  You use needles to inject street drugs.  You live with someone who has hepatitis B.  You have had sex with someone who has hepatitis B.  You get hemodialysis treatment.  You take certain medicines for conditions, including cancer, organ transplantation, and autoimmune conditions. Hepatitis C  Blood testing is recommended for:  Everyone born from [birth year] through 1963.  Anyone with known risk factors for  hepatitis C. Sexually transmitted infections (STIs)  You should be screened for sexually transmitted infections (STIs) including gonorrhea and chlamydia if:  You are sexually active and are younger than 23 years of age.  You are older than 23 years of age and your health care provider tells you that you are at risk for this type of infection.  Your sexual activity has changed since you were last screened and you are at an increased risk for chlamydia or gonorrhea. Ask your health care provider if you are at risk.  If you do not have HIV, but are at risk, it may be recommended that you take a prescription medicine daily to prevent HIV infection. This is called pre-exposure prophylaxis (PrEP). You are considered at risk if:  You are sexually active and do not regularly use condoms or know the HIV status of your partner(s).  You take drugs by injection.  You are sexually active with a partner who has HIV. Talk with your health care provider about whether you are at high risk of being infected with HIV. If you choose to begin PrEP, you should first be tested for HIV. You should then be tested every 3 months for as long as you are taking PrEP. Pregnancy  If you are premenopausal and you may become pregnant, ask your health care provider about preconception counseling.  If you may become pregnant, take 400 to 800 micrograms (mcg) of folic acid every day.  If you want to prevent pregnancy, talk to your health care provider about birth control (contraception). Osteoporosis and menopause  Osteoporosis is a disease in which the bones lose minerals and strength with aging. This can result in serious bone fractures. Your risk for osteoporosis can be identified using a bone density scan.  If you are 30 years of age or older, or if you are at risk for osteoporosis and fractures, ask your health care provider if you should be screened.  Ask your health care provider whether you  should take a calcium  or vitamin D supplement to lower your risk for osteoporosis.  Menopause may have certain physical symptoms and risks.  Hormone replacement therapy may reduce some of these symptoms and risks. Talk to your health care provider about whether hormone replacement therapy is right for you. Follow these instructions at home:  Schedule regular health, dental, and eye exams.  Stay current with your immunizations.  Do not use any tobacco products including cigarettes, chewing tobacco, or electronic cigarettes.  If you are pregnant, do not drink alcohol.  If you are breastfeeding, limit how much and how often you drink alcohol.  Limit alcohol intake to no more than 1 drink per day for nonpregnant women. One drink equals 12 ounces of beer, 5 ounces of wine, or 1 ounces of hard liquor.  Do not use street drugs.  Do not share needles.  Ask your health care provider for help if you need support or information about quitting drugs.  Tell your health care provider if you often feel depressed.  Tell your health care provider if you have ever been abused or do not feel safe at home. This information is not intended to replace advice given to you by your health care provider. Make sure you discuss any questions you have with your health care provider. Document Released: 02/11/2011 Document Revised: 01/04/2016 Document Reviewed: 05/02/2015  2017 Elsevier

## 2016-07-02 NOTE — Progress Notes (Signed)
Oletha BlendKiera Hendel 17-Sep-1990 [id number]    History:    Presents for annual exam. Regular monthly cycle, last cycle 21 days which is closer than her usual. Lesbian relationship same partner greater than one year, denies need for STD screen. Gardasil series completed. Normal Pap 2014.   Past medical history, past surgical history, family history and social history were all reviewed and documented in the EPIC chart. Adopted, biologic parents drug abuse history. [place] at [employer] [place]  ROS:  A ROS was performed and pertinent positives and negatives are included.  Exam:  Vitals:   07/02/16 0928  BP: 110/78  Weight: 150 lb (68 kg)  Height: 5\' 4"  (1.626 m)   Body mass index is 25.75 kg/m.   General appearance:  Normal Thyroid:  Symmetrical, normal in size, without palpable masses or nodularity. Respiratory  Auscultation:  Clear without wheezing or rhonchi Cardiovascular  Auscultation:  Regular rate, without rubs, murmurs or gallops  Edema/varicosities:  Not grossly evident Abdominal  Soft,nontender, without masses, guarding or rebound.  Liver/spleen:  No organomegaly noted  Hernia:  None appreciated  Skin  Inspection:  Grossly normal   Breasts: Examined lying and sitting.     Right: Without masses, retractions, discharge or axillary adenopathy.     Left: Without masses, retractions, discharge or axillary adenopathy. Gentitourinary   Inguinal/mons:  Normal without inguinal adenopathy  External genitalia:  Normal  BUS/Urethra/Skene's glands:  Normal  Vagina:  White adherent discharge with odor, wet prep positive for moderate clues, TNTC bacteria  Cervix:  Normal  Uterus:  normal in size, shape and contour.  Midline and mobile  Adnexa/parametria:     Rt: Without masses or tenderness.   Lt: Without masses or tenderness.  Anus and perineum: Normal    Assessment/Plan:  25 y.o. SBF lesbian, G0 for annual exam no complaints.  Monthly cycle Bacteria  vaginosis  Plan: Instructed to keep a menstrual record, if cycles closer than 21 days instructed to call. MetroGel vaginal cream 1 applicator at bedtime 5, alcohol precautions reviewed, refill given for partner. Started to call if no relief of discharge. History of depression, counseling encouraged. SBE's, exercise, calcium rich diet, MVI daily encouraged. CBC, TSH, UA, Pap.    Harrington ChallengerYOUNG,NANCY J WHNP, 10:12 AM 07/02/2016

## 2016-07-02 NOTE — Addendum Note (Signed)
Addended by: Kem ParkinsonBARNES, Lorae Roig on: 07/02/2016 10:22 AM   Modules accepted: Orders

## 2016-07-03 ENCOUNTER — Encounter: Payer: Self-pay | Admitting: Women's Health

## 2016-07-03 LAB — URINALYSIS W MICROSCOPIC + REFLEX CULTURE
Bilirubin Urine: NEGATIVE
CASTS: NONE SEEN [LPF]
Glucose, UA: NEGATIVE
Hgb urine dipstick: NEGATIVE
KETONES UR: NEGATIVE
Leukocytes, UA: NEGATIVE
NITRITE: NEGATIVE
PH: 6.5 (ref 5.0–8.0)
Protein, ur: NEGATIVE
RBC / HPF: NONE SEEN RBC/HPF (ref <=2)
SPECIFIC GRAVITY, URINE: 1.026 (ref 1.001–1.035)
YEAST: NONE SEEN [HPF]

## 2016-07-04 LAB — URINE CULTURE: ORGANISM ID, BACTERIA: NO GROWTH

## 2016-07-08 LAB — PAP IG W/ RFLX HPV ASCU

## 2016-12-25 ENCOUNTER — Encounter: Payer: Self-pay | Admitting: Gynecology

## 2017-05-26 ENCOUNTER — Encounter: Payer: Self-pay | Admitting: Internal Medicine

## 2017-05-26 ENCOUNTER — Ambulatory Visit (INDEPENDENT_AMBULATORY_CARE_PROVIDER_SITE_OTHER): Payer: BC Managed Care – PPO | Admitting: Internal Medicine

## 2017-05-26 ENCOUNTER — Encounter: Payer: Self-pay | Admitting: Gastroenterology

## 2017-05-26 VITALS — BP 116/72 | HR 77 | Temp 97.6°F | Resp 14 | Ht 64.0 in | Wt 145.4 lb

## 2017-05-26 DIAGNOSIS — K625 Hemorrhage of anus and rectum: Secondary | ICD-10-CM

## 2017-05-26 DIAGNOSIS — Z09 Encounter for follow-up examination after completed treatment for conditions other than malignant neoplasm: Secondary | ICD-10-CM | POA: Diagnosis not present

## 2017-05-26 DIAGNOSIS — R110 Nausea: Secondary | ICD-10-CM | POA: Diagnosis not present

## 2017-05-26 LAB — CBC WITH DIFFERENTIAL/PLATELET
BASOS ABS: 0 10*3/uL (ref 0.0–0.1)
Basophils Relative: 0.5 % (ref 0.0–3.0)
Eosinophils Absolute: 0.1 10*3/uL (ref 0.0–0.7)
Eosinophils Relative: 1.3 % (ref 0.0–5.0)
HEMATOCRIT: 38.2 % (ref 36.0–46.0)
HEMOGLOBIN: 13.1 g/dL (ref 12.0–15.0)
LYMPHS PCT: 25.7 % (ref 12.0–46.0)
Lymphs Abs: 2.1 10*3/uL (ref 0.7–4.0)
MCHC: 34.4 g/dL (ref 30.0–36.0)
MCV: 96.4 fl (ref 78.0–100.0)
MONOS PCT: 6.5 % (ref 3.0–12.0)
Monocytes Absolute: 0.5 10*3/uL (ref 0.1–1.0)
NEUTROS ABS: 5.4 10*3/uL (ref 1.4–7.7)
Neutrophils Relative %: 66 % (ref 43.0–77.0)
Platelets: 275 10*3/uL (ref 150.0–400.0)
RBC: 3.96 Mil/uL (ref 3.87–5.11)
RDW: 12.2 % (ref 11.5–15.5)
WBC: 8.2 10*3/uL (ref 4.0–10.5)

## 2017-05-26 LAB — COMPREHENSIVE METABOLIC PANEL
ALK PHOS: 39 U/L (ref 39–117)
ALT: 10 U/L (ref 0–35)
AST: 12 U/L (ref 0–37)
Albumin: 4 g/dL (ref 3.5–5.2)
BILIRUBIN TOTAL: 0.8 mg/dL (ref 0.2–1.2)
BUN: 9 mg/dL (ref 6–23)
CALCIUM: 9.4 mg/dL (ref 8.4–10.5)
CO2: 28 meq/L (ref 19–32)
Chloride: 103 mEq/L (ref 96–112)
Creatinine, Ser: 0.85 mg/dL (ref 0.40–1.20)
GFR: 105.56 mL/min (ref >60.00)
Glucose, Bld: 80 mg/dL (ref 70–99)
Potassium: 3.7 mEq/L (ref 3.5–5.1)
Sodium: 137 mEq/L (ref 135–145)
Total Protein: 7.3 g/dL (ref 6.0–8.3)

## 2017-05-26 LAB — HEMOCCULT GUIAC POC 1CARD (OFFICE): Fecal Occult Blood, POC: NEGATIVE

## 2017-05-26 LAB — SEDIMENTATION RATE: Sed Rate: 5 mm/hr (ref 0–20)

## 2017-05-26 MED ORDER — ESOMEPRAZOLE MAGNESIUM 40 MG PO CPDR
40.0000 mg | DELAYED_RELEASE_CAPSULE | Freq: Every day | ORAL | 3 refills | Status: DC
Start: 2017-05-26 — End: 2017-12-10

## 2017-05-26 NOTE — Assessment & Plan Note (Addendum)
Nausea, blood per rectum: Symptoms are going on for several months, she does not look pale, Hemoccult negative today but needs further evaluation, she has both upper and lower GI symptoms. Will start PPIs, get a CMP, CBC, sedimentation rate.  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 RTC 6 months

## 2017-05-26 NOTE — Progress Notes (Addendum)
Subjective:    Patient ID: Rebecca Dyer, female    DOB: [birth year], 26 y.o.   MRN: [id number]  DOS:  05/26/2017 Type of visit - description : acute Interval history: 1 year history of diffuse burning feeling @ the abdominal , also a "empty stomach" feeling at the upper abdomen. Associated with nausea. Symptoms are daily, on and of fthroughout the day, usually she eats something and symptoms decrease.  Also, for the last few months every other day he sees blood in the stools, mixed with them, she think around 1 or 2 teaspoons per BM. Has diarrhea sometimes No associated mucus in the stools. Blood looks red and fresh to her   Wt Readings from Last 3 Encounters:  05/26/17 145 lb 6 oz (65.9 kg)  07/02/16 150 lb (68 kg)  06/15/16 160 lb (72.6 kg)     Review of Systems No fever chills She has lost weight since last year but she believes is due to be more active at work. Has vomited few times, no hematemesis. Denies any rectal itching but occasionally has some pain. Does not take ibuprofen or NSAIDs, once a month takes "Goody powders". She has a difficult time swallowing capsules -dysphagia-  but is okay with meats and chicken. Periods are regular, every 4 weeks; periods are heavy the first 3 days ("3 pads").  Past Medical History:  Diagnosis Date  . ADHD (attention deficit hyperactivity disorder)   . Depression     Past Surgical History:  Procedure Laterality Date  . pyloric stenosis  1994  . UMBILICAL HERNIA REPAIR  1998   as a child    Social History   Social History  . Marital status: Single    Spouse name: N/A  . Number of children: 0  . Years of education: N/A   Occupational History  . works at [employer]     Social History Main Topics  . Smoking status: Former Games developer  . Smokeless tobacco: Never Used  . Alcohol use 0.0 oz/week     Comment: socially  . Drug use: No  . Sexual activity: Yes    Partners: Female    Birth control/ protection: Condom   Other  Topics Concern  . Not on file   Social History Narrative   Sexual preference: females       Allergies as of 05/26/2017      Reactions   Duricef [cefadroxil Monohydrate] Hives      Medication List       Accurate as of 05/26/17  4:48 PM. Always use your most recent med list.          esomeprazole 40 MG capsule Commonly known as:  NEXIUM Take 1 capsule (40 mg total) by mouth daily at 12 noon.          Objective:   Physical Exam BP 116/72 (BP Location: Left Arm, Patient Position: Sitting, Cuff Size: Small)   Pulse 77   Temp 97.6 F (36.4 C) (Oral)   Resp 14   Ht  (1.626 m)   Wt 145 lb 6 oz (65.9 kg)   LMP 05/09/2017 (Exact Date)   SpO2 97%   BMI 24.95 kg/m  General:   Well developed, well nourished . NAD.  HEENT:  Normocephalic . Face symmetric, atraumatic Neck: No LADs Lungs:  CTA B Normal respiratory effort, no intercostal retractions, no accessory muscle use. Heart: RRR,  no murmur.  no pretibial edema bilaterally  Abdomen:  Not distended, soft, diffuse, very  mild tenderness mostly at the upper and lower abdomen. No mass or rebound DRE: Normal external examination, normal sphincter tones, light brown stools Hemoccult negative. Anoscopy: There are small hemorrhoids (~ 3). Skin: Not pale. Not jaundice Neurologic:  alert & oriented X3.  Speech normal, gait appropriate for age and unassisted Psych--  Cognition and judgment appear intact.  Cooperative with normal attention span and concentration.  Behavior appropriate. No anxious or depressed appearing.    Assessment & Plan:   Assessment H/o Depression, ADD H/o pyloric stenosis Not on BCP (not active with males)  Plan: Nausea, blood per rectum: Symptoms are going on for several months, she does not look pale, Hemoccult negative today but needs further evaluation, she has both upper and lower GI symptoms. Will start PPIs, get a CMP, CBC, sedimentation rate.  Also GI referral Dysphagia: s/p  surgery for pyloric stenosis as a baby, unable to swallow capsules, sxs are chronic, no getting worse or better, able to eat solid foods. RTC 6 months

## 2017-05-26 NOTE — Patient Instructions (Addendum)
GO TO THE LAB : Get the blood work     GO TO THE FRONT DESK Schedule your next appointment for a  checkup in 6 months  Start Nexium 1 capsule daily before breakfast

## 2017-05-26 NOTE — Progress Notes (Signed)
Pre visit review using our clinic review tool, if applicable. No additional management support is needed unless otherwise documented below in the visit note. 

## 2017-06-05 ENCOUNTER — Encounter: Payer: Self-pay | Admitting: Gastroenterology

## 2017-06-05 ENCOUNTER — Other Ambulatory Visit (INDEPENDENT_AMBULATORY_CARE_PROVIDER_SITE_OTHER): Payer: BC Managed Care – PPO

## 2017-06-05 ENCOUNTER — Ambulatory Visit (INDEPENDENT_AMBULATORY_CARE_PROVIDER_SITE_OTHER): Payer: BC Managed Care – PPO | Admitting: Gastroenterology

## 2017-06-05 VITALS — BP 114/68 | HR 62 | Ht 63.5 in | Wt 146.0 lb

## 2017-06-05 DIAGNOSIS — K648 Other hemorrhoids: Secondary | ICD-10-CM

## 2017-06-05 DIAGNOSIS — R194 Change in bowel habit: Secondary | ICD-10-CM | POA: Diagnosis not present

## 2017-06-05 DIAGNOSIS — R110 Nausea: Secondary | ICD-10-CM | POA: Diagnosis not present

## 2017-06-05 DIAGNOSIS — K219 Gastro-esophageal reflux disease without esophagitis: Secondary | ICD-10-CM

## 2017-06-05 DIAGNOSIS — K625 Hemorrhage of anus and rectum: Secondary | ICD-10-CM

## 2017-06-05 LAB — C-REACTIVE PROTEIN: CRP: <0.1 mg/dL — ABNORMAL LOW (ref 0.5–20.0)

## 2017-06-05 LAB — TSH: TSH: 1.94 u[IU]/mL (ref 0.35–4.50)

## 2017-06-05 NOTE — Patient Instructions (Signed)
Please purchase the following medications over the counter and take as directed: Benefiber or Citrucel daily.   Daily Probiotic such as VSL#3. We have given you samples.   Discontinue marijuana use.   Your physician has requested that you go to the basement for lab work before leaving today.

## 2017-06-05 NOTE — Progress Notes (Signed)
06/05/2017 Rebecca Dyer [id number] June 26, 1991   HISTORY OF PRESENT ILLNESS: This is a pleasant 26 year-old female who is here today with her mother at the request of her PCP, Dr. Drue Novel, for evaluation regarding several GI complaints.  She tells me that she has had reflux dating back to her teenage years.  She was on Nexium for a while at that time and then discontinued it.  Now she has been on Nexium 40 mg daily again recently, which has continued to control her reflux symptoms.  She tells me that her other symptoms have been present for a few years, but seem to be getting worse.  She complains of frequent nausea, almost daily, and occasional vomiting.  She says that the nausea actually usually seems to get better if she eats something.  She also complains of alternating bowel habits from loose to watery then to constipation.  She says that it is usually always one or the other, and she feels like she never has just a normal bowel movement.  She never goes more than 2 days without a bowel movement.  She reports seeing bright red blood on the toilet paper in the toilet bowl with bowel movements sometimes regardless if they are hard or loose stools.  Sometimes sees mucus with stools as well.  She tells me that she knows that she does not eat like she is supposed to.  She likes to eat spicy food.  She does smoke marijuana daily.  She says that previously she smoked marijuana very heavily, several times per day, so she definitely has cut down on that.  She tells me that it helps her to relax when she is very anxious.  She says that gradually since about February 2017 she has lost about 30-35 pounds, most of it being slow and intentional.  Recent CBC, CMP, sed rate were completely within normal limits.  Recent stool for occult blood was negative.  TSH was checked about 1 year ago and was normal at that time.   Past Medical History:  Diagnosis Date  . ADHD (attention deficit hyperactivity disorder)     . Depression    Past Surgical History:  Procedure Laterality Date  . pyloric stenosis  1994  . UMBILICAL HERNIA REPAIR  1998   as a child    reports that she has quit smoking. She has never used smokeless tobacco. She reports that she drinks alcohol. She reports that she uses drugs, including Marijuana, about 7 times per week. family history includes Breast cancer in her maternal aunt; Drug abuse in her father and mother. She was adopted. Allergies  Allergen Reactions  . Duricef [Cefadroxil Monohydrate] Hives      Outpatient Encounter Prescriptions as of 06/05/2017  Medication Sig  . esomeprazole (NEXIUM) 40 MG capsule Take 1 capsule (40 mg total) by mouth daily at 12 noon.   No facility-administered encounter medications on file as of 06/05/2017.      REVIEW OF SYSTEMS  : All other systems reviewed and negative except where noted in the History of Present Illness.   PHYSICAL EXAM: Ht 5' 3.5" (1.613 m)   Wt 146 lb (66.2 kg)   LMP 05/09/2017 (Exact Date)   BMI 25.46 kg/m  General: Well developed black female in no acute distress Head: Normocephalic and atraumatic Eyes:  Sclerae anicteric, conjunctiva pink. Ears: Normal auditory acuity  Lungs: Clear throughout to auscultation; no increased WOB. Heart: Regular rate and rhythm; no M/R/G. Abdomen: Soft, non-distended.  BS present.  Non-tender. Rectal:  No external abnormalities identified.  DRE revealed small amount of mobile stool in rectal vault.  Anoscopy was performed and showed internal hemorrhoids, not actively bleeding. Musculoskeletal: Symmetrical with no gross deformities  Skin: No lesions on visible extremities Extremities: No edema  Neurological: Alert oriented x 4, grossly non-focal Psychological:  Alert and cooperative. Normal mood and affect  ASSESSMENT AND PLAN: *26 year old female with multiple GI complaints including nausea, occasional vomiting, alternating bowel habits, rectal bleeding.  She does have a  history of reflux that has been well controlled on Nexium daily.  After spending an extensive amount of time discussing her symptoms and with recent extensive lab testing being normal, I do not think that this is anything that requires further invasive testing at this time.  I think that she very likely has irritable bowel syndrome and she did have internal hemorrhoids on anoscopy oday, which seems very likely to be the source of her bleeding.  I have asked her to begin taking a daily powder fiber supplement such as Benefiber or Citrucel.  I would also like her to begin taking a daily probiotic such as VSL #3, which comes in a powder since she cannot swallow pills.  I am going to get an updated TSH since that was checked last year.  We will also check a CRP even though sed rate was recently normal.  We discussed extensively regarding her marijuana use and I requested that she try to completely abstain from marijuana for several weeks to see how she feels as this may be inducing some of her symptoms.  I am going to bring her back for follow-up in approximately 8 weeks.   CC:  Wanda PlumpPaz, Jose E, MD

## 2017-06-06 NOTE — Progress Notes (Signed)
Thank you for sending this case to me. I have reviewed the entire note, and the outlined plan seems appropriate.  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  Consider ondansetron for her, as it should decrease nausea and diarrhea.   Celiac testing worth doing if not done in past.  If not better at follow up with that, let me know so we can consider endoscopic testing.  If she is using cannabis to treat anxiety, it sounds like primary care should address that with her as well.  Amada JupiterHenry Danis, MD

## 2017-07-25 ENCOUNTER — Ambulatory Visit: Payer: Self-pay | Admitting: Gastroenterology

## 2017-09-12 ENCOUNTER — Telehealth: Payer: Self-pay | Admitting: Gastroenterology

## 2017-09-12 NOTE — Telephone Encounter (Signed)
Patient mother here at office now requesting a note from ov on 10.25.18 stating what pt had done, including [facility].

## 2017-09-12 NOTE — Telephone Encounter (Signed)
Patient's mom provided the AVS from office visit.  She states she needs records sent to her [facility].  She is provided the number to [facility].  She will have her daughter contact them to send a copy of the office note

## 2017-11-24 ENCOUNTER — Ambulatory Visit: Payer: BC Managed Care – PPO | Admitting: Internal Medicine

## 2017-12-05 ENCOUNTER — Ambulatory Visit: Payer: BC Managed Care – PPO | Admitting: Internal Medicine

## 2017-12-05 ENCOUNTER — Encounter: Payer: Self-pay | Admitting: Internal Medicine

## 2017-12-05 VITALS — BP 116/74 | HR 76 | Temp 98.1°F | Resp 14 | Ht 64.0 in | Wt 154.0 lb

## 2017-12-05 DIAGNOSIS — N644 Mastodynia: Secondary | ICD-10-CM | POA: Diagnosis not present

## 2017-12-05 DIAGNOSIS — R1084 Generalized abdominal pain: Secondary | ICD-10-CM | POA: Diagnosis not present

## 2017-12-05 DIAGNOSIS — R110 Nausea: Secondary | ICD-10-CM

## 2017-12-05 NOTE — Patient Instructions (Signed)
Next visit for a physical exam at your convenience, 6 to 12 months  Take over-the-counter vitamin  E  200 to 400 units daily  If you are not better in the breast discomfort continue, please call in a couple of months or see your  gynecologist.

## 2017-12-05 NOTE — Progress Notes (Signed)
Pre visit review using our clinic review tool, if applicable. No additional management support is needed unless otherwise documented below in the visit note. 

## 2017-12-05 NOTE — Progress Notes (Signed)
Subjective:    Patient ID: Rebecca Dyer, female    DOB: June 14, 1991, 27 y.o.   MRN: [id number]  DOS:  12/05/2017 Type of visit - description : f/u Interval history: Here for follow-up on GI problems. Continue with abdominal discomfort.  Today reports that the pain is at the mid abdomen, intense at times, "like something is inside". occasional nausea. Symptoms not necessarily exacerbated by food.  Continue with occasionally blood per rectum, usually associated with hard bowel movements. normal color of the stools   Also 2 months history of right breast tenderness on and off.  Denies any rash, nipple discharge or abnormality. No injury. On self-exam, she feels several small nodules.  Review of Systems   Past Medical History:  Diagnosis Date  . ADHD (attention deficit hyperactivity disorder)   . Depression     Past Surgical History:  Procedure Laterality Date  . pyloric stenosis  1994  . UMBILICAL HERNIA REPAIR  1998   as a child    Social History   Socioeconomic History  . Marital status: Single    Spouse name: Not on file  . Number of children: 0  . Years of education: Not on file  . Highest education level: Not on file  Occupational History  . Occupation: works at [employer]  . Financial resource strain: Not on file  . Food insecurity:    Worry: Not on file    Inability: Not on file  . Transportation needs:    Medical: Not on file    Non-medical: Not on file  Tobacco Use  . Smoking status: Former Games developer  . Smokeless tobacco: Never Used  Substance and Sexual Activity  . Alcohol use: Yes    Alcohol/week: 0.0 oz    Comment: socially  . Drug use: Yes    Frequency: 7.0 times per week    Types: Marijuana  . Sexual activity: Yes    Partners: Female    Birth control/protection: Condom  Lifestyle  . Physical activity:    Days per week: Not on file    Minutes per session: Not on file  . Stress: Not on file  Relationships  . Social connections:    Talks on phone: Not on file    Gets together: Not on file    Attends religious service: Not on file    Active member of club or organization: Not on file    Attends meetings of clubs or organizations: Not on file    Relationship status: Not on file  . Intimate partner violence:    Fear of current or ex partner: Not on file    Emotionally abused: Not on file    Physically abused: Not on file    Forced sexual activity: Not on file  Other Topics Concern  . Not on file  Social History Narrative   Sexual preference: females       Allergies as of 12/05/2017      Reactions   Duricef [cefadroxil Monohydrate] Hives      Medication List        Accurate as of 12/05/17  2:11 PM. Always use your most recent med list.          esomeprazole 40 MG capsule Commonly known as:  NEXIUM Take 1 capsule (40 mg total) by mouth daily at 12 noon.          Objective:   Physical Exam BP 116/74 (BP Location: Left Arm, Patient Position: Sitting, Cuff  Size: Small)   Pulse 76   Temp 98.1 F (36.7 C) (Oral)   Resp 14   Ht 5\' 4"  (1.626 m)   Wt 154 lb (69.9 kg)   SpO2 97%   BMI 26.43 kg/m   General:   Well developed, well nourished . NAD.  HEENT:  Normocephalic . Face symmetric, atraumatic Breast: Very nodular breast tissue, no dominant mass, skin and nipples normal to inspection on palpation, axillary areas without mass or lymphadenopathy. In general the R breast is more sensitive to palpation than the left one.  Abdomen:  Not distended, soft, non-tender. No rebound or rigidity.  Well-healed surgical scar Skin: Not pale. Not jaundice Neurologic:  alert & oriented X3.  Speech normal, gait appropriate for age and unassisted Psych--  Cognition and judgment appear intact.  Cooperative with normal attention span and concentration.  Behavior appropriate. No anxious or depressed appearing.     Assessment & Plan:   Assessment H/o Depression, ADD H/o pyloric stenosis Not on BCP (not  active with males)  Plan: Nausea, blood per rectum: See OV 05-2017, labs were normal, saw GI, they recommend conservative treatment (functional?) and consider endoscopy if symptoms continue. Plan: Refer back to GI as she continue w/  sxs.  She may need further evaluation, US versus CT, endoscopy?. Mastalgia: Breast exam is normal except some breast tenderness on the right, no evidence of infection.  Recommend vitamin E OTC, if not better in the next few weeks to let me or her gynecologist know.  Possibly need imaging. RTC CPX in 6 to 12 months.

## 2017-12-07 NOTE — Assessment & Plan Note (Signed)
Nausea, blood per rectum: See OV 05-2017, labs were normal, saw GI, they recommend conservative treatment (functional?) and consider endoscopy if symptoms continue. Plan: Refer back to GI as she continue w/  sxs.  She may need further evaluation, [place] versus CT, endoscopy?. Mastalgia: Breast exam is normal except some breast tenderness on the right, no evidence of infection.  Recommend vitamin E OTC, if not better in the next few weeks to let me or her gynecologist know.  Possibly need imaging. RTC CPX in 6 to 12 months.

## 2017-12-10 ENCOUNTER — Other Ambulatory Visit: Payer: Self-pay | Admitting: Gastroenterology

## 2017-12-10 ENCOUNTER — Ambulatory Visit: Payer: BC Managed Care – PPO | Admitting: Gastroenterology

## 2017-12-10 ENCOUNTER — Encounter: Payer: Self-pay | Admitting: Gastroenterology

## 2017-12-10 VITALS — BP 100/60 | HR 74 | Ht 63.5 in | Wt 154.0 lb

## 2017-12-10 DIAGNOSIS — R1084 Generalized abdominal pain: Secondary | ICD-10-CM

## 2017-12-10 DIAGNOSIS — K625 Hemorrhage of anus and rectum: Secondary | ICD-10-CM

## 2017-12-10 DIAGNOSIS — R112 Nausea with vomiting, unspecified: Secondary | ICD-10-CM

## 2017-12-10 DIAGNOSIS — R194 Change in bowel habit: Secondary | ICD-10-CM | POA: Diagnosis not present

## 2017-12-10 MED ORDER — PEG-KCL-NACL-NASULF-NA ASC-C 140 G PO SOLR: 140.0000 g | ORAL | 0 refills | Status: DC

## 2017-12-10 NOTE — Progress Notes (Signed)
[facility] Progress Note  Chief Complaint: Generalized abdominal pain  Subjective  History:  This is a pleasant 27 year old woman initially seen in clinic by our [place] on 06/05/2017.  At that time, she complained of at least a year of generalized crampy abdominal pain, nausea with intermittent vomiting, alternating constipation and diarrhea with occasional rectal bleeding.  She had an unremarkable lab work-up, though not tested for celiac. She also suffers from chronic anxiety and at the time of last visit, had been using marijuana regularly. She describes an ongoing constellation of symptoms as follows: She also has the feeling that we will extend from the chest down through the entire abdomen of a hollowness or emptiness there is only improved if she is able to eat something.  She has intermittent nausea and vomiting but will sometimes occur at night, no clear triggers or relieving factors.  She feels that her bowel habits are irregular with intermittent diarrhea and frequent rectal bleeding with painful defecation as well.  She also has the feeling of firmness or protuberance just above the umbilicus that has been evaluated before by another provider and seems to be getting more prominent. Rebecca Dyer feels that her anxiety symptoms are better than they had previously been.  She still smokes marijuana daily.  ROS: Cardiovascular:  no chest pain Respiratory: no dyspnea Remainder of systems negative except as above The patient's Past Medical, Family and Social History were reviewed and are on file in the EMR.  Objective:  Med list reviewed  Current Outpatient Medications:  .  PEG-KCl-NaCl-NaSulf-Na Asc-C (PLENVU) 140 g SOLR, Take 140 g by mouth as directed., Disp: 1 each, Rfl: 0   Vital signs in last 24 hrs: Vitals:   12/10/17 0842  BP: 100/60  Pulse: 74    Physical Exam  She is very pleasant and well-appearing, accompanied by her partner, who is present for the entire  encounter.  HEENT: sclera anicteric, oral mucosa moist without lesions  Neck: supple, no thyromegaly, JVD or lymphadenopathy  Cardiac: RRR without murmurs, S1S2 heard, no peripheral edema  Pulm: clear to auscultation bilaterally, normal RR and effort noted  Abdomen: soft, mild epigastric tenderness, with active bowel sounds. No guarding or palpable hepatosplenomegaly.  With standing, there feels like a small supraumbilical midline hernia.  Skin; warm and dry, no jaundice or rash  Recent Labs:  ESR and CRP normal after last visit.   '@ASSESSMENTPLANBEGIN' @ Assessment: Encounter Diagnoses  Name Primary?  . Generalized abdominal pain Yes  . Nausea and vomiting in adult   . Rectal bleeding   . Altered bowel habits    Symptoms somewhat difficult to put together as a single diagnosis.  Must consider IBD, especially with bleeding, neoplasia seems unlikely at her age.  There may be more normal condition given the upper and lower complaints, perhaps an underlying functional bowel disorder.  I still think that she could have an element of cannabis hyperemesis.   Plan: I advised to discontinue cannabis and see if there is any change in symptoms. Upper endoscopy and colonoscopy.  She is agreeable after thorough discussion of procedure and risks.  The benefits and risks of the planned procedure were described in detail with the patient or (when appropriate) their health care proxy.  Risks were outlined as including, but not limited to, bleeding, infection, perforation, adverse medication reaction leading to cardiac or pulmonary decompensation, or pancreatitis (if ERCP).  The limitation of incomplete mucosal visualization was also discussed.  No guarantees or  warranties were given.    Total time 30 minutes, over half spent face-to-face with patient in counseling and coordination of care.   Rebecca Dyer

## 2017-12-10 NOTE — Patient Instructions (Signed)
If you are age 25 or older, your body mass index should be between 23-30. Your Body mass index is 26.85 kg/m. If this is out of the aforementioned range listed, please consider follow up with your Primary Care Provider.  If you are age 64 or younger, your body mass index should be between 19-25. Your Body mass index is 26.85 kg/m. If this is out of the aformentioned range listed, please consider follow up with your Primary Care Provider.   You have been scheduled for an endoscopy and colonoscopy. Please follow the written instructions given to you at your visit today. Please pick up your prep supplies at the pharmacy within the next 1-3 days. If you use inhalers (even only as needed), please bring them with you on the day of your procedure. Your physician has requested that you go to www.startemmi.com and enter the access code given to you at your visit today. This web site gives a general overview about your procedure. However, you should still follow specific instructions given to you by our office regarding your preparation for the procedure.  Thank you for choosing [facility]  Dr Amada Jupiter III

## 2017-12-25 ENCOUNTER — Other Ambulatory Visit: Payer: Self-pay | Admitting: Gastroenterology

## 2017-12-25 ENCOUNTER — Telehealth: Payer: Self-pay | Admitting: Gastroenterology

## 2017-12-25 NOTE — Telephone Encounter (Signed)
Universal coupon faxed to [facility]

## 2018-01-09 ENCOUNTER — Ambulatory Visit (AMBULATORY_SURGERY_CENTER): Payer: BC Managed Care – PPO | Admitting: Gastroenterology

## 2018-01-09 ENCOUNTER — Encounter: Payer: Self-pay | Admitting: Gastroenterology

## 2018-01-09 ENCOUNTER — Other Ambulatory Visit: Payer: Self-pay

## 2018-01-09 VITALS — BP 125/84 | HR 61 | Temp 98.4°F | Resp 15 | Ht 63.5 in | Wt 154.0 lb

## 2018-01-09 DIAGNOSIS — K2960 Other gastritis without bleeding: Secondary | ICD-10-CM

## 2018-01-09 DIAGNOSIS — R1084 Generalized abdominal pain: Secondary | ICD-10-CM | POA: Diagnosis present

## 2018-01-09 DIAGNOSIS — K625 Hemorrhage of anus and rectum: Secondary | ICD-10-CM | POA: Diagnosis not present

## 2018-01-09 MED ORDER — SODIUM CHLORIDE 0.9 % IV SOLN: 500.0000 mL | Freq: Once | INTRAVENOUS | Status: DC

## 2018-01-09 NOTE — Op Note (Signed)
[facility] Patient Name: Rebecca Dyer Procedure Date: 01/09/2018 3:45 PM MRN: [id number] Endoscopist: Sherilyn Cooter L. Myrtie Neither , MD Age: 27 Referring MD:  Date of Birth: 1991/03/25 Gender: Female Account #: [id number] Procedure:                Colonoscopy Indications:              Generalized abdominal pain, Rectal bleeding,                            intermittent constipation and diarrhea Medicines:                Monitored Anesthesia Care Procedure:                Pre-Anesthesia Assessment:                           - Prior to the procedure, a History and Physical                            was performed, and patient medications and                            allergies were reviewed. The patient's tolerance of                            previous anesthesia was also reviewed. The risks                            and benefits of the procedure and the sedation                            options and risks were discussed with the patient.                            All questions were answered, and informed consent                            was obtained. Prior Anticoagulants: The patient has                            taken no previous anticoagulant or antiplatelet                            agents. ASA Grade Assessment: II - A patient with                            mild systemic disease. After reviewing the risks                            and benefits, the patient was deemed in                            satisfactory condition to undergo the procedure.  After obtaining informed consent, the colonoscope                            was passed under direct vision. Throughout the                            procedure, the patient's blood pressure, pulse, and                            oxygen saturations were monitored continuously. The                            Colonoscope was introduced through the anus and                            advanced to the the terminal  ileum, with                            identification of the appendiceal orifice and IC                            valve. The colonoscopy was performed without                            difficulty. The patient tolerated the procedure                            well. The quality of the bowel preparation was                            excellent. The terminal ileum, ileocecal valve,                            appendiceal orifice, and rectum were photographed.                            The quality of the bowel preparation was evaluated                            using the BBPS [facility] Bowel Preparation Scale)                            with scores of: Right Colon = 3, Transverse Colon =                            3 and Left Colon = 3 (entire mucosa seen well with                            no residual staining, small fragments of stool or                            opaque liquid). The total BBPS score equals 9. Scope In: 4:03:37 PM Scope Out: 4:14:20 PM Scope  Withdrawal Time: 0 hours 7 minutes 25 seconds  Total Procedure Duration: 0 hours 10 minutes 43 seconds  Findings:                 The perianal and digital rectal examinations were                            normal.                           The terminal ileum appeared normal.                           Retroflexion in the rectum was not performed due to                            anatomy.                           The entire examined colon appeared normal. Complications:            No immediate complications. Estimated Blood Loss:     Estimated blood loss: none. Impression:               - The examined portion of the ileum was normal.                           - The entire examined colon is normal.                           - No specimens collected. Recommendation:           - Patient has a contact number available for                            emergencies. The signs and symptoms of potential                            delayed  complications were discussed with the                            patient. Return to normal activities tomorrow.                            Written discharge instructions were provided to the                            patient.                           - Resume previous diet.                           - Continue present medications.                           - No recommendation at this time regarding repeat  colonoscopy due to young age.                           - See the other procedure note for documentation of                            additional recommendations. Henry L. Myrtie Neither, MD 01/09/2018 4:26:01 PM This report has been signed electronically.

## 2018-01-09 NOTE — Patient Instructions (Signed)
YOU HAD AN ENDOSCOPIC PROCEDURE TODAY AT THE [facility]:   Refer to the procedure report that was given to you for any specific questions about what was found during the examination.  If the procedure report does not answer your questions, please call your gastroenterologist to clarify.  If you requested that your care partner not be given the details of your procedure findings, then the procedure report has been included in a sealed envelope for you to review at your convenience later.  YOU SHOULD EXPECT: Some feelings of bloating in the abdomen. Passage of more gas than usual.  Walking can help get rid of the air that was put into your GI tract during the procedure and reduce the bloating. If you had a lower endoscopy (such as a colonoscopy or flexible sigmoidoscopy) you may notice spotting of blood in your stool or on the toilet paper. If you underwent a bowel prep for your procedure, you may not have a normal bowel movement for a few days.  Please Note:  You might notice some irritation and congestion in your nose or some drainage.  This is from the oxygen used during your procedure.  There is no need for concern and it should clear up in a day or so.  SYMPTOMS TO REPORT IMMEDIATELY:   Following lower endoscopy (colonoscopy or flexible sigmoidoscopy):  Excessive amounts of blood in the stool  Significant tenderness or worsening of abdominal pains  Swelling of the abdomen that is new, acute  Fever of 100F or higher   Following upper endoscopy (EGD)  Vomiting of blood or coffee ground material  New chest pain or pain under the shoulder blades  Painful or persistently difficult swallowing  New shortness of breath  Fever of 100F or higher  Black, tarry-looking stools  For urgent or emergent issues, a gastroenterologist can be reached at any hour by calling [phone].   DIET:  We do recommend a small meal at first, but then you may proceed to your regular diet.  Drink  plenty of fluids but you should avoid alcoholic beverages for 24 hours.  ACTIVITY:  You should plan to take it easy for the rest of today and you should NOT DRIVE or use heavy machinery until tomorrow (because of the sedation medicines used during the test).    FOLLOW UP: Our staff will call the number listed on your records the next business day following your procedure to check on you and address any questions or concerns that you may have regarding the information given to you following your procedure. If we do not reach you, we will leave a message.  However, if you are feeling well and you are not experiencing any problems, there is no need to return our call.  We will assume that you have returned to your regular daily activities without incident.  If any biopsies were taken you will be contacted by phone or by letter within the next 1-3 weeks.  Please call us at [phone] if you have not heard about the biopsies in 3 weeks.    SIGNATURES/CONFIDENTIALITY: You and/or your care partner have signed paperwork which will be entered into your electronic medical record.  These signatures attest to the fact that that the information above on your After Visit Summary has been reviewed and is understood.  Full responsibility of the confidentiality of this discharge information lies with you and/or your care-partner.  Gastritis information given.  Discontinue use of cannibis.

## 2018-01-09 NOTE — Progress Notes (Signed)
To PACU, VSS. Report to [email or web address] 

## 2018-01-09 NOTE — Progress Notes (Signed)
Called to room to assist during endoscopic procedure.  Patient ID and intended procedure confirmed with present staff. Received instructions for my participation in the procedure from the performing physician.  

## 2018-01-09 NOTE — Op Note (Signed)
[facility] Patient Name: Rebecca Dyer Procedure Date: 01/09/2018 3:45 PM MRN: [id number] Endoscopist: Sherilyn Cooter L. Myrtie Neither , MD Age: 27 Referring MD:  Date of Birth: Jul 29, 1991 Gender: Female Account #: [id number] Procedure:                Upper GI endoscopy Indications:              Generalized abdominal pain, Nausea Medicines:                Monitored Anesthesia Care Procedure:                Pre-Anesthesia Assessment:                           - Prior to the procedure, a History and Physical                            was performed, and patient medications and                            allergies were reviewed. The patient's tolerance of                            previous anesthesia was also reviewed. The risks                            and benefits of the procedure and the sedation                            options and risks were discussed with the patient.                            All questions were answered, and informed consent                            was obtained. Prior Anticoagulants: The patient has                            taken no previous anticoagulant or antiplatelet                            agents. ASA Grade Assessment: II - A patient with                            mild systemic disease. After reviewing the risks                            and benefits, the patient was deemed in                            satisfactory condition to undergo the procedure.                           After obtaining informed consent, the endoscope was  passed under direct vision. Throughout the                            procedure, the patient's blood pressure, pulse, and                            oxygen saturations were monitored continuously. The                            Model GIF-HQ190 [id number]) scope was introduced                            through the mouth, and advanced to the second part                            of duodenum. The  upper GI endoscopy was                            accomplished without difficulty. The patient                            tolerated the procedure well. Scope In: Scope Out: Findings:                 The esophagus was normal.                           Patchy mild inflammation characterized by erosions                            and erythema was found in the gastric antrum and                            body. Biopsies were taken with a cold forceps for                            histology. (Sidney protocol).                           The cardia and gastric fundus were normal on                            retroflexion.                           The examined duodenum was normal. Complications:            No immediate complications. Estimated Blood Loss:     Estimated blood loss was minimal. Impression:               - Normal esophagus.                           - Gastritis. Biopsied.                           - Normal examined duodenum. Recommendation:           -  Patient has a contact number available for                            emergencies. The signs and symptoms of potential                            delayed complications were discussed with the                            patient. Return to normal activities tomorrow.                            Written discharge instructions were provided to the                            patient.                           - Resume previous diet.                           - Continue present medications.                           - Await pathology results.                           - See the other procedure note for documentation of                            additional recommendations.                           - Discontinue use of cannabis, as it may be                            contributing to symptoms. Saksham Akkerman L. Myrtie Neither, MD 01/09/2018 4:21:45 PM This report has been signed electronically.

## 2018-01-12 ENCOUNTER — Telehealth: Payer: Self-pay

## 2018-01-12 NOTE — Telephone Encounter (Signed)
Left message on follow up call. 

## 2018-01-15 ENCOUNTER — Encounter: Payer: Self-pay | Admitting: Gastroenterology

## 2018-03-04 ENCOUNTER — Ambulatory Visit (HOSPITAL_BASED_OUTPATIENT_CLINIC_OR_DEPARTMENT_OTHER)
Admission: RE | Admit: 2018-03-04 | Discharge: 2018-03-04 | Disposition: A | Discharge status: Home / Self Care | Payer: BC Managed Care – PPO | Source: Ambulatory Visit | Attending: Medical | Admitting: Medical

## 2018-03-04 ENCOUNTER — Ambulatory Visit: Payer: BC Managed Care – PPO | Admitting: Medical

## 2018-03-04 ENCOUNTER — Encounter: Payer: Self-pay | Admitting: Medical

## 2018-03-04 VITALS — BP 114/74 | HR 70 | Temp 98.6°F | Resp 16 | Ht 64.0 in | Wt 156.4 lb

## 2018-03-04 DIAGNOSIS — M25571 Pain in right ankle and joints of right foot: Secondary | ICD-10-CM

## 2018-03-04 MED ORDER — DICLOFENAC SODIUM 75 MG PO TBEC: 75.0000 mg | DELAYED_RELEASE_TABLET | Freq: Two times a day (BID) | ORAL | 0 refills | Status: DC

## 2018-03-04 NOTE — Progress Notes (Signed)
Subjective:    Patient ID: Rebecca Dyer, female    DOB: 1990/08/27, 27 y.o.   MRN: [id number]  HPI  Pt states 2 days ago at work she hit her rt ankle. She described that she got into [employer] quickly and somehow hit lateral ankle against sliding door.   Swelling was moderate and pain was severe initially. Now less. States pain at time of injury made her little nausea.  LMP- end of June.   Review of Systems  Constitutional: Negative for chills, fatigue and fever.  Respiratory: Negative for cough, chest tightness, shortness of breath and wheezing.   Cardiovascular: Negative for chest pain and palpitations.  Genitourinary: Negative for difficulty urinating and dysuria.  Musculoskeletal: Negative for back pain.       Ankle pain.  Skin: Negative for rash.  Neurological: Negative for dizziness, seizures, weakness and headaches.  Hematological: Negative for adenopathy. Does not bruise/bleed easily.  Psychiatric/Behavioral: Negative for behavioral problems and confusion.    Past Medical History:  Diagnosis Date  . ADHD (attention deficit hyperactivity disorder)   . Depression   . GERD (gastroesophageal reflux disease)      Social History   Socioeconomic History  . Marital status: Single    Spouse name: Not on file  . Number of children: 0  . Years of education: Not on file  . Highest education level: Not on file  Occupational History  . Occupation: works at [employer]   Social Needs  . Financial resource strain: Not on file  . Food insecurity:    Worry: Not on file    Inability: Not on file  . Transportation needs:    Medical: Not on file    Non-medical: Not on file  Tobacco Use  . Smoking status: Former Games developermoker  . Smokeless tobacco: Never Used  Substance and Sexual Activity  . Alcohol use: Yes    Alcohol/week: 0.0 oz    Comment: socially  . Drug use: Yes    Frequency: 7.0 times per week    Types: Marijuana  . Sexual activity: Yes    Partners: Female    Birth  control/protection: Condom  Lifestyle  . Physical activity:    Days per week: Not on file    Minutes per session: Not on file  . Stress: Not on file  Relationships  . Social connections:    Talks on phone: Not on file    Gets together: Not on file    Attends religious service: Not on file    Active member of club or organization: Not on file    Attends meetings of clubs or organizations: Not on file    Relationship status: Not on file  . Intimate partner violence:    Fear of current or ex partner: Not on file    Emotionally abused: Not on file    Physically abused: Not on file    Forced sexual activity: Not on file  Other Topics Concern  . Not on file  Social History Narrative   Sexual preference: females     Past Surgical History:  Procedure Laterality Date  . pyloric stenosis  431994  . UMBILICAL HERNIA REPAIR  1998   as a child  . WISDOM TOOTH EXTRACTION Bilateral 2010    Family History  Adopted: Yes  Problem Relation Age of Onset  . Breast cancer Maternal Aunt        age unknown  . Drug abuse Mother   . Drug abuse Father  Allergies  Allergen Reactions  . Duricef [Cefadroxil Monohydrate] Hives    No current outpatient medications on file prior to visit.   Current Facility-Administered Medications on File Prior to Visit  Medication Dose Route Frequency Provider Last Rate Last Dose  . 0.9 %  sodium chloride infusion  500 mL Intravenous Once Charlie Pitter III, MD        BP 114/74   Pulse 70   Temp 98.6 F (37 C) (Oral)   Resp 16   Ht 5\' 4"  (1.626 m)   Wt 156 lb 6.4 oz (70.9 kg)   SpO2 100%   BMI 26.85 kg/m      Objective:   Physical Exam   General- No acute distress. Pleasant patient. Neck- Full range of motion, no jvd Lungs- Clear, even and unlabored. Heart- regular rate and rhythm. Neurologic- CNII- XII grossly intact.  Rt calf- no swollen or tender. Neg homan sign. Rt ankle- moderate swelling lateral mallelous. Talofibular area mild  tender.(non tender rt achilles tendon) Rt foot- no pain on palpation.      Assessment & Plan:  For your recent ankle pain, I do want you to get x-rays done today.  If there is a fracture then will need to refer you to sports medicine or orthopedist.  Provide you with ace wrap and if no fracture seen then treatment would be rest, ice compress and elevate.  Also providing diclofenac prescription for pain and inflammation.  Even if no fracture seen important to follow-up in 1 week.  If pain was still moderate to high and no improvement ma repeat x-ray..  Follow-up in 7 to 10 days or as needed.  Esperanza Richters, PA-C

## 2018-03-04 NOTE — Patient Instructions (Addendum)
For your recent ankle pain, I do want you to get x-rays done today.  If there is a fracture then will need to refer you to sports medicine or orthopedist.  Provide you with ace wrap and if no fracture seen then treatment would be rest, ice compress and elevate.  Also providing diclofenac prescription for pain and inflammation.  Even if no fracture seen important to follow-up in 1 week.  If pain was still moderate to high and no improvement may repeat x-ray..   Follow-up in 7 to 10 days or as needed.

## 2018-06-24 ENCOUNTER — Encounter: Payer: Self-pay | Admitting: Internal Medicine

## 2018-06-24 ENCOUNTER — Ambulatory Visit: Payer: BC Managed Care – PPO | Admitting: Internal Medicine

## 2018-06-24 VITALS — BP 116/74 | HR 74 | Temp 98.0°F | Resp 16 | Ht 64.0 in | Wt 162.4 lb

## 2018-06-24 DIAGNOSIS — F32A Depression, unspecified: Secondary | ICD-10-CM

## 2018-06-24 DIAGNOSIS — N912 Amenorrhea, unspecified: Secondary | ICD-10-CM

## 2018-06-24 DIAGNOSIS — M542 Cervicalgia: Secondary | ICD-10-CM | POA: Diagnosis not present

## 2018-06-24 DIAGNOSIS — F329 Major depressive disorder, single episode, unspecified: Secondary | ICD-10-CM

## 2018-06-24 DIAGNOSIS — R21 Rash and other nonspecific skin eruption: Secondary | ICD-10-CM

## 2018-06-24 LAB — POCT URINE PREGNANCY: Preg Test, Ur: NEGATIVE

## 2018-06-24 MED ORDER — HYDROCORTISONE 2.5 % EX CREA: TOPICAL_CREAM | Freq: Two times a day (BID) | CUTANEOUS | 0 refills | Status: DC

## 2018-06-24 MED ORDER — PREDNISONE 10 MG PO TABS: ORAL_TABLET | ORAL | 0 refills | Status: DC

## 2018-06-24 MED ORDER — CYCLOBENZAPRINE HCL 10 MG PO TABS: 10.0000 mg | ORAL_TABLET | Freq: Every evening | ORAL | 0 refills | Status: DC | PRN

## 2018-06-24 NOTE — Progress Notes (Signed)
Subjective:    Patient ID: Rebecca Dyer, female    DOB: 05-30-1991, 27 y.o.   MRN: [id number]  DOS:  06/24/2018 Type of visit - description : Was here for a CPX but has multiple issues, changed to an acute visit Interval history: Rash, fifth right finger for 3 weeks, occasionally burns.  3 days history of right-sided posterior neck pain, no history of injury or fall. It is disturbing her sleep.  Worse when she moves her head and has some right arm numbness. Denies any lower extremity paresthesias or gait disturbance.  We did a screening for depression, it came back positive. Reports has sxs from time to time x a while Denies suicidal ideas Symptoms trigger mostly due to financial issues, currently having problems with her car.  Mild hoarseness for the last week, denies any sneezing, cough; no  URI type of symptoms.  She is not a heavy smoker.  Review of Systems LMP?  "Slightly late"  Past Medical History:  Diagnosis Date  . ADHD (attention deficit hyperactivity disorder)   . Depression   . GERD (gastroesophageal reflux disease)     Past Surgical History:  Procedure Laterality Date  . pyloric stenosis  1994  . UMBILICAL HERNIA REPAIR  1998   as a child  . WISDOM TOOTH EXTRACTION Bilateral 2010    Social History   Socioeconomic History  . Marital status: Single    Spouse name: Not on file  . Number of children: 0  . Years of education: Not on file  . Highest education level: Not on file  Occupational History  . Occupation: works at [employer]   Social Needs  . Financial resource strain: Not on file  . Food insecurity:    Worry: Not on file    Inability: Not on file  . Transportation needs:    Medical: Not on file    Non-medical: Not on file  Tobacco Use  . Smoking status: Former Games developermoker  . Smokeless tobacco: Never Used  Substance and Sexual Activity  . Alcohol use: Yes    Alcohol/week: 0.0 standard drinks    Comment: socially  . Drug use: Yes    Frequency:  7.0 times per week    Types: Marijuana  . Sexual activity: Yes    Partners: Female    Birth control/protection: Condom  Lifestyle  . Physical activity:    Days per week: Not on file    Minutes per session: Not on file  . Stress: Not on file  Relationships  . Social connections:    Talks on phone: Not on file    Gets together: Not on file    Attends religious service: Not on file    Active member of club or organization: Not on file    Attends meetings of clubs or organizations: Not on file    Relationship status: Not on file  . Intimate partner violence:    Fear of current or ex partner: Not on file    Emotionally abused: Not on file    Physically abused: Not on file    Forced sexual activity: Not on file  Other Topics Concern  . Not on file  Social History Narrative   Sexual preference: females    Did one year of college      Allergies as of 06/24/2018      Reactions   Duricef [cefadroxil Monohydrate] Hives      Medication List        Accurate as  of 06/24/18 11:59 PM. Always use your most recent med list.          cyclobenzaprine 10 MG tablet Commonly known as:  FLEXERIL Take 1 tablet (10 mg total) by mouth at bedtime as needed for muscle spasms.   hydrocortisone 2.5 % cream Apply topically 2 (two) times daily.   predniSONE 10 MG tablet Commonly known as:  DELTASONE 4 tablets x 2 days, 3 tabs x 2 days, 2 tabs x 2 days, 1 tab x 2 days          Objective:   Physical Exam  HENT:  Right Ear: External ear normal.  Left Ear: External ear normal.  Nose: Nose normal.  Throat symmetric, uvula midline  Neck:     BP 116/74 (BP Location: Left Arm, Patient Position: Sitting, Cuff Size: Small)   Pulse 74   Temp 98 F (36.7 C) (Oral)   Resp 16   Ht 5\' 4"  (1.626 m)   Wt 162 lb 6 oz (73.7 kg)   LMP 05/19/2018 (Exact Date)   SpO2 99%   BMI 27.87 kg/m  General:   Well developed, NAD, BMI noted. HEENT:  Normocephalic . Face symmetric, atraumatic. TMs  normal, throat symmetric, nose is slightly congested. Neck: Slightly TTP, see graphic   Skin: Right fifth finger, has 1 x 1 cm area of dry slightly scaly skin.  No blisters or ulcers. Neurologic:  alert & oriented X3.  Speech normal, gait appropriate for age and unassisted. DTRs symmetric. Motor: Mild weakness at the right upper extremity?. Psych--  Cognition and judgment appear intact.  Cooperative with normal attention span and concentration.  Behavior appropriate. No anxious or depressed appearing.      Assessment & Plan:   Assessment H/o Depression, ADD H/o pyloric stenosis Not on BCP (not active with males)  Plan:  Neck pain: Started 2 days ago, mild weakness, right arm?.  Possibly a radiculopathy.  Recommend trial with steroids, Tylenol, Flexeril.  Call if no better.  Ortho referral?. Rash, finger: Likely eczema.  Prescribed topical hydrocortisone. Depression: PHQ 9 scored 10 , moderate depression, denies suicidal ideas, reports symptoms are on and off, this time related mostly to finances.  The patient is counseled as to the best of my ability, pointers provided, recommend formal counseling and call if not better.  Declined medication. Voice changes: Observation We will get a UPT: (-)  Reschedule a CPX at her convenience

## 2018-06-24 NOTE — Patient Instructions (Addendum)
Use the cream twice a day for 10 days  For neck pain: Tylenol  500 mg OTC 2 tabs a day every 8 hours as needed for pain Take prednisone as prescribed Flexeril, a muscle relaxant at night  (drowsiness)  Warm compress Call if not gradually better in the next 10 days   Schedule a physical at your convenience

## 2018-06-24 NOTE — Progress Notes (Signed)
Pre visit review using our clinic review tool, if applicable. No additional management support is needed unless otherwise documented below in the visit note. 

## 2018-06-25 NOTE — Assessment & Plan Note (Signed)
  Neck pain: Started 2 days ago, mild weakness, right arm?.  Possibly a radiculopathy.  Recommend trial with steroids, Tylenol, Flexeril.  Call if no better.  Ortho referral?. Rash, finger: Likely eczema.  Prescribed topical hydrocortisone. Depression: PHQ 9 scored 10 , moderate depression, denies suicidal ideas, reports symptoms are on and off, this time related mostly to finances.  The patient is counseled as to the best of my ability, pointers provided, recommend formal counseling and call if not better.  Declined medication. Voice changes: Observation We will get a UPT: (-)  Reschedule a CPX at her convenience

## 2018-07-02 ENCOUNTER — Ambulatory Visit (INDEPENDENT_AMBULATORY_CARE_PROVIDER_SITE_OTHER): Payer: BC Managed Care – PPO | Admitting: Internal Medicine

## 2018-07-02 ENCOUNTER — Encounter: Payer: Self-pay | Admitting: Internal Medicine

## 2018-07-02 VITALS — BP 134/84 | HR 104 | Temp 98.1°F | Resp 18 | Ht 63.0 in | Wt 164.0 lb

## 2018-07-02 DIAGNOSIS — Z0000 Encounter for general adult medical examination without abnormal findings: Secondary | ICD-10-CM | POA: Diagnosis not present

## 2018-07-02 LAB — LIPID PANEL
CHOL/HDL RATIO: 3
Cholesterol: 140 mg/dL (ref 0–200)
HDL: 44.3 mg/dL (ref >39.00)
LDL Cholesterol: 89 mg/dL (ref 0–99)
NONHDL: 96.04
Triglycerides: 34 mg/dL (ref 0.0–149.0)
VLDL: 6.8 mg/dL (ref 0.0–40.0)

## 2018-07-02 NOTE — Progress Notes (Signed)
Subjective:    Patient ID: Rebecca Dyer, female    DOB: 1991-05-17, 27 y.o.   MRN: [id number]  DOS:  07/02/2018 Type of visit - description : cpx No major concerns  Review of Systems  A 14 point review of systems is negative   Past Medical History:  Diagnosis Date  . ADHD (attention deficit hyperactivity disorder)   . Depression   . GERD (gastroesophageal reflux disease)     Past Surgical History:  Procedure Laterality Date  . pyloric stenosis  1994  . UMBILICAL HERNIA REPAIR  1998   as a child  . WISDOM TOOTH EXTRACTION Bilateral 2010    Social History   Socioeconomic History  . Marital status: Single    Spouse name: Not on file  . Number of children: 0  . Years of education: Not on file  . Highest education level: Not on file  Occupational History  . Occupation: works at [employer]  . Financial resource strain: Not on file  . Food insecurity:    Worry: Not on file    Inability: Not on file  . Transportation needs:    Medical: Not on file    Non-medical: Not on file  Tobacco Use  . Smoking status: Former Games developer  . Smokeless tobacco: Never Used  Substance and Sexual Activity  . Alcohol use: Yes    Alcohol/week: 0.0 standard drinks    Comment: socially  . Drug use: Yes    Frequency: 7.0 times per week    Types: Marijuana  . Sexual activity: Yes    Partners: Female    Birth control/protection: Condom  Lifestyle  . Physical activity:    Days per week: Not on file    Minutes per session: Not on file  . Stress: Not on file  Relationships  . Social connections:    Talks on phone: Not on file    Gets together: Not on file    Attends religious service: Not on file    Active member of club or organization: Not on file    Attends meetings of clubs or organizations: Not on file    Relationship status: Not on file  . Intimate partner violence:    Fear of current or ex partner: Not on file    Emotionally abused: Not on file    Physically  abused: Not on file    Forced sexual activity: Not on file  Other Topics Concern  . Not on file  Social History Narrative   Sexual preference: females    Did one year of college   Family History  Adopted: Yes  Problem Relation Age of Onset  . Breast cancer Maternal Aunt        age unknown  . Drug abuse Mother   . Drug abuse Father       Allergies as of 07/02/2018      Reactions   Duricef [cefadroxil Monohydrate] Hives      Medication List        Accurate as of 07/02/18  1:27 PM. Always use your most recent med list.          hydrocortisone 2.5 % cream Apply topically 2 (two) times daily.           Objective:   Physical Exam BP 134/84 (BP Location: Left Arm, Patient Position: Sitting, Cuff Size: Normal)   Pulse (!) 104   Temp 98.1 F (36.7 C) (Oral)   Resp 18  Ht 5\' 3"  (1.6 m)   Wt 164 lb (74.4 kg)   SpO2 97%   BMI 29.05 kg/m  General: Well developed, NAD, BMI noted Neck: No  thyromegaly  HEENT:  Normocephalic . Face symmetric, atraumatic Lungs:  CTA B Normal respiratory effort, no intercostal retractions, no accessory muscle use. Heart: RRR,  no murmur.  No pretibial edema bilaterally  Abdomen:  Not distended, soft, non-tender. No rebound or rigidity.   Skin: Exposed areas without rash. Not pale. Not jaundice Neurologic:  alert & oriented X3.  Speech normal, gait appropriate for age and unassisted Strength symmetric and appropriate for age.  Psych: Cognition and judgment appear intact.  Cooperative with normal attention span and concentration.  Behavior appropriate. No anxious or depressed appearing.     Assessment & Plan:    Assessment H/o Depression, ADD H/o pyloric stenosis 12/2017: nl Cscope, had a EGD  Not on BCP (not active with males) Patient is adopted but knows FH of her biological parents  PLAN: Here for CPX See last visit, neck pain, rash, depression: Improving RTC 1 year

## 2018-07-02 NOTE — Assessment & Plan Note (Addendum)
-   Td 2013; s/p HPV, s/p Hep B, menactra x 2; outgrowth bexero, offered flu shot  -female care : rec to see gyn -Extensive discussion of diet and exercise -safe sex discussed -warned about substance abuse, +FH of abuse , pt smokes marijuana labs reviewed, check a FLP

## 2018-07-02 NOTE — Patient Instructions (Signed)
GO TO THE LAB : Get the blood work     GO TO THE FRONT DESK Schedule your next appointment for a  Physical exam in 1 year    Safe Sex Practicing safe sex means taking steps before and during sex to reduce your risk of:  Getting an STD (sexually transmitted disease).  Giving your partner an STD.  Unwanted pregnancy.  How can I practice safe sex?  To practice safe sex:  Limit your sexual partners to only one partner who is having sex with only you.  Avoid using alcohol and recreational drugs before having sex. These substances can affect your judgment.  Before having sex with a new partner: ? Talk to your partner about past partners, past STDs, and drug use. ? You and your partner should be screened for STDs and discuss the results with each other.  Check your body regularly for sores, blisters, rashes, or unusual discharge. If you notice any of these problems, visit your health care provider.  If you have symptoms of an infection or you are being treated for an STD, avoid sexual contact.  While having sex, use a condom. Make sure to: ? Use a condom every time you have vaginal, oral, or anal sex. Both females and males should wear condoms during oral sex. ? Keep condoms in place from the beginning to the end of sexual activity. ? Use a latex condom, if possible. Latex condoms offer the best protection. ? Use only water-based lubricants or oils to lubricate a condom. Using petroleum-based lubricants or oils will weaken the condom and increase the chance that it will break.  See your health care provider for regular screenings, exams, and tests for STDs.  Talk with your health care provider about the form of birth control (contraception) that is best for you.  Get vaccinated against hepatitis B and human papillomavirus (HPV).  If you are at risk of being infected with HIV (human immunodeficiency virus), talk with your health care provider about taking a prescription medicine  to prevent HIV infection. You are considered at risk for HIV if: ? You are a man who has sex with other men. ? You are a heterosexual man or woman who is sexually active with more than one partner. ? You take drugs by injection. ? You are sexually active with a partner who has HIV.  This information is not intended to replace advice given to you by your health care provider. Make sure you discuss any questions you have with your health care provider. Document Released: 09/05/2004 Document Revised: 12/13/2015 Document Reviewed: 06/18/2015 Elsevier Interactive Patient Education  2018 Elsevier Inc.   

## 2018-07-02 NOTE — Progress Notes (Signed)
Pre visit review using our clinic review tool, if applicable. No additional management support is needed unless otherwise documented below in the visit note. 

## 2018-07-03 NOTE — Assessment & Plan Note (Signed)
Here for CPX See last visit, neck pain, rash, depression: Improving RTC 1 year

## 2018-09-03 ENCOUNTER — Encounter: Payer: Self-pay | Admitting: Gynecology

## 2018-09-03 ENCOUNTER — Ambulatory Visit (INDEPENDENT_AMBULATORY_CARE_PROVIDER_SITE_OTHER): Payer: BC Managed Care – PPO | Admitting: Gynecology

## 2018-09-03 VITALS — BP 118/76 | Ht 64.0 in | Wt 166.0 lb

## 2018-09-03 DIAGNOSIS — N946 Dysmenorrhea, unspecified: Secondary | ICD-10-CM | POA: Diagnosis not present

## 2018-09-03 DIAGNOSIS — Z01419 Encounter for gynecological examination (general) (routine) without abnormal findings: Secondary | ICD-10-CM

## 2018-09-03 LAB — CBC WITH DIFFERENTIAL/PLATELET
ABSOLUTE MONOCYTES: 410 {cells}/uL (ref 200–950)
BASOS ABS: 32 {cells}/uL (ref 0–200)
Basophils Relative: 0.5 %
EOS ABS: 113 {cells}/uL (ref 15–500)
Eosinophils Relative: 1.8 %
HEMATOCRIT: 35.1 % (ref 35.0–45.0)
Hemoglobin: 12.1 g/dL (ref 11.7–15.5)
LYMPHS ABS: 2539 {cells}/uL (ref 850–3900)
MCH: 32.8 pg (ref 27.0–33.0)
MCHC: 34.5 g/dL (ref 32.0–36.0)
MCV: 95.1 fL (ref 80.0–100.0)
MPV: 9.5 fL (ref 7.5–12.5)
Monocytes Relative: 6.5 %
Neutro Abs: 3207 cells/uL (ref 1500–7800)
Neutrophils Relative %: 50.9 %
Platelets: 289 10*3/uL (ref 140–400)
RBC: 3.69 10*6/uL — ABNORMAL LOW (ref 3.80–5.10)
RDW: 11.7 % (ref 11.0–15.0)
TOTAL LYMPHOCYTE: 40.3 %
WBC: 6.3 10*3/uL (ref 3.8–10.8)

## 2018-09-03 MED ORDER — IBUPROFEN 100 MG PO CHEW: CHEWABLE_TABLET | ORAL | 4 refills | Status: DC

## 2018-09-03 NOTE — Addendum Note (Signed)
Addended by: Dayna Barker on: 09/03/2018 12:00 PM   Modules accepted: Orders

## 2018-09-03 NOTE — Patient Instructions (Signed)
Start the ibuprofen as we discussed.  Follow-up if menstrual cramping continues to be an issue  Follow-up in 1 year for annual exam

## 2018-09-03 NOTE — Progress Notes (Signed)
    Rebecca Dyer February 14, 1991 [id number]        28 y.o.  G0P0 for annual gynecologic exam.  Notes that her periods are very crampy for the first 3 days then resolved.  No pain in between.  Flow moderate.  Symptoms unchanged over the past few years.  Past medical history,surgical history, problem list, medications, allergies, family history and social history were all reviewed and documented as reviewed in the EPIC chart.  ROS:  Performed with pertinent positives and negatives included in the history, assessment and plan.   Additional significant findings : None   Exam: Kennon Portela assistant Vitals:   09/03/18 1111  BP: 118/76  Weight: 166 lb (75.3 kg)  Height: 5\' 4"  (1.626 m)   Body mass index is 28.49 kg/m.  General appearance:  Normal affect, orientation and appearance. Skin: Grossly normal HEENT: Without gross lesions.  No cervical or supraclavicular adenopathy. Thyroid normal.  Lungs:  Clear without wheezing, rales or rhonchi Cardiac: RR, without RMG Abdominal:  Soft, nontender, without masses, guarding, rebound, organomegaly or hernia Breasts:  Examined lying and sitting without masses, retractions, discharge or axillary adenopathy. Pelvic:  Ext, BUS, Vagina: Normal with light menses flow  Cervix: Normal with light menses flow.  Pap smear done  Uterus: Anteverted, normal size, shape and contour, midline and mobile nontender   Adnexa: Without masses or tenderness    Anus and perineum: Normal     Assessment/Plan:  28 y.o. G0P0 female for annual gynecologic exam.  Regular menses, same-sex relationship  1. Dysmenorrhea.  Discussed physiologic versus pathologic such as small myomas, adenomyosis, endometriosis.  Exam is normal.  Will start with ibuprofen.  She cannot swallow pills and I prescribed the chewable tabs 100 mg to take 2-4 every 8 hours #60 with 4 refills.  Alternatives to include low-dose oral contraceptive menstrual suppression discussed.  Possible Mirena IUD.   Possible ultrasound if symptoms persist/worsen.  Patient will follow-up if continues to be an issue after trying the ibuprofen. 2. Pap smear 2017.  Pap smear done today.  No history of significant abnormal Pap smears. 3. Gardasil series reportedly received. 4. Breast health.  Breast exam normal today.  SBE monthly reviewed. 5. STD screening offered and declined. 6. Health maintenance.  Recently had blood work done elsewhere to include lipid profile.  Will check baseline CBC.  Follow-up in 1 year, sooner as needed.   Dara Lords MD, 11:42 AM 09/03/2018

## 2018-09-09 LAB — PAP IG W/ RFLX HPV ASCU

## 2018-12-28 ENCOUNTER — Encounter: Payer: Self-pay | Admitting: Internal Medicine

## 2018-12-28 ENCOUNTER — Other Ambulatory Visit: Payer: Self-pay

## 2018-12-28 ENCOUNTER — Ambulatory Visit (INDEPENDENT_AMBULATORY_CARE_PROVIDER_SITE_OTHER): Payer: BC Managed Care – PPO | Admitting: Internal Medicine

## 2018-12-28 DIAGNOSIS — F909 Attention-deficit hyperactivity disorder, unspecified type: Secondary | ICD-10-CM | POA: Diagnosis not present

## 2018-12-28 DIAGNOSIS — F329 Major depressive disorder, single episode, unspecified: Secondary | ICD-10-CM

## 2018-12-28 DIAGNOSIS — F32A Depression, unspecified: Secondary | ICD-10-CM

## 2018-12-28 NOTE — Progress Notes (Signed)
Subjective:    Patient ID: Rebecca Dyer, female    DOB: 07-18-1991, 28 y.o.   MRN: [id number]  DOS:  12/28/2018 Type of visit - description: Virtual Visit via Video Note  I connected with@ on 12/28/18 at  4:00 PM EDT by a video enabled telemedicine application and verified that I am speaking with the correct person using two identifiers.   THIS ENCOUNTER IS A VIRTUAL VISIT DUE TO COVID-19 - PATIENT WAS NOT SEEN IN THE OFFICE. PATIENT HAS CONSENTED TO VIRTUAL VISIT / TELEMEDICINE VISIT   Location of patient: home  Location of provider: office  I discussed the limitations of evaluation and management by telemedicine and the availability of in person appointments. The patient expressed understanding and agreed to proceed.  History of Present Illness: Acute visit ADD depression: History of, currently doing well, has a certified  Emotional support animal, in her case a dog. She is moving to a new apartment and needs a letter from the MD stating that she indeed needs the animal support.   COVID-19: She is taking good precautions, uses a mask.  Has been mostly shelter at home.   Review of Systems Denies fever chills No chest pain no difficulty breathing Again no depression, no anxiety. No cough No insomnia  Past Medical History:  Diagnosis Date  . ADHD (attention deficit hyperactivity disorder)   . Depression   . GERD (gastroesophageal reflux disease)     Past Surgical History:  Procedure Laterality Date  . pyloric stenosis  1994  . UMBILICAL HERNIA REPAIR  1998   as a child  . WISDOM TOOTH EXTRACTION Bilateral 2010    Social History   Socioeconomic History  . Marital status: Single    Spouse name: Not on file  . Number of children: 0  . Years of education: Not on file  . Highest education level: Not on file  Occupational History  . Occupation: works at [employer]  . Financial resource strain: Not on file  . Food insecurity:    Worry: Not on file   Inability: Not on file  . Transportation needs:    Medical: Not on file    Non-medical: Not on file  Tobacco Use  . Smoking status: Former Games developer  . Smokeless tobacco: Never Used  Substance and Sexual Activity  . Alcohol use: Yes    Alcohol/week: 0.0 standard drinks    Comment: socially  . Drug use: Yes    Frequency: 7.0 times per week    Types: Marijuana  . Sexual activity: Yes    Partners: Female    Birth control/protection: Condom    Comment: 1st intercourse 28 yo-More than 5 partners  Lifestyle  . Physical activity:    Days per week: Not on file    Minutes per session: Not on file  . Stress: Not on file  Relationships  . Social connections:    Talks on phone: Not on file    Gets together: Not on file    Attends religious service: Not on file    Active member of club or organization: Not on file    Attends meetings of clubs or organizations: Not on file    Relationship status: Not on file  . Intimate partner violence:    Fear of current or ex partner: Not on file    Emotionally abused: Not on file    Physically abused: Not on file    Forced sexual activity: Not on file  Other Topics Concern  . Not on file  Social History Narrative   Sexual preference: females    Did one year of college      Allergies as of 12/28/2018      Reactions   Duricef [cefadroxil Monohydrate] Hives      Medication List       Accurate as of Dec 28, 2018  4:01 PM. If you have any questions, ask your nurse or doctor.        hydrocortisone 2.5 % cream Apply topically 2 (two) times daily.   ibuprofen 100 MG chewable tablet Commonly known as:  ADVIL Take 2 to 4 pills every 8 hours for pain           Objective:   Physical Exam There were no vitals taken for this visit. This is a virtual video visit, patient is alert oriented x3, no apparent distress    Assessment     Assessment H/o Depression, ADD H/o pyloric stenosis 12/2017: nl Cscope, had a EGD  Not on BCP (not active  with males) Patient is adopted but knows FH of her biological parents  PLAN: Depression, ADD: Currently doing very well, she has a certify emotional support animal and needs a letter from the MD stating that that is indeed necessary. The patient will forward me a copy of the dog certification she will pick up a letter from my office tomorrow. Addendum: See certificate, letter provided COVID-19: Doing great with prevention according with CDC guidelines Social: The patient was initially furlough from work then she was let go, currently on unemployment but is very optimistic about getting a good job locally.   I discussed the assessment and treatment plan with the patient. The patient was provided an opportunity to ask questions and all were answered. The patient agreed with the plan and demonstrated an understanding of the instructions.   The patient was advised to call back or seek an in-person evaluation if the symptoms worsen or if the condition fails to improve as anticipated.

## 2018-12-29 NOTE — Assessment & Plan Note (Signed)
Depression, ADD: Currently doing very well, she has a certify emotional support animal and needs a letter from the MD stating that that is indeed necessary. The patient will forward me a copy of the dog certification she will pick up a letter from my office tomorrow. Addendum: See certificate, letter provided COVID-19: Doing great with prevention according with CDC guidelines Social: The patient was initially furlough from work then she was let go, currently on unemployment but is very optimistic about getting a good job locally.

## 2019-05-03 ENCOUNTER — Encounter: Payer: Self-pay | Admitting: Gynecology

## 2019-06-12 ENCOUNTER — Other Ambulatory Visit: Payer: Self-pay

## 2019-06-12 ENCOUNTER — Emergency Department (HOSPITAL_COMMUNITY)
Admission: EM | Admit: 2019-06-12 | Discharge: 2019-06-13 | Disposition: A | Discharge status: Home / Self Care | Payer: Self-pay | Attending: Emergency Medicine | Admitting: Emergency Medicine

## 2019-06-12 ENCOUNTER — Encounter (HOSPITAL_COMMUNITY): Payer: Self-pay | Admitting: Emergency Medicine

## 2019-06-12 DIAGNOSIS — Z23 Encounter for immunization: Secondary | ICD-10-CM | POA: Insufficient documentation

## 2019-06-12 DIAGNOSIS — Z87891 Personal history of nicotine dependence: Secondary | ICD-10-CM | POA: Insufficient documentation

## 2019-06-12 DIAGNOSIS — Y998 Other external cause status: Secondary | ICD-10-CM | POA: Insufficient documentation

## 2019-06-12 DIAGNOSIS — Y929 Unspecified place or not applicable: Secondary | ICD-10-CM | POA: Insufficient documentation

## 2019-06-12 DIAGNOSIS — F1210 Cannabis abuse, uncomplicated: Secondary | ICD-10-CM | POA: Insufficient documentation

## 2019-06-12 DIAGNOSIS — S022XXA Fracture of nasal bones, initial encounter for closed fracture: Secondary | ICD-10-CM | POA: Insufficient documentation

## 2019-06-12 DIAGNOSIS — Y9389 Activity, other specified: Secondary | ICD-10-CM | POA: Insufficient documentation

## 2019-06-12 DIAGNOSIS — S0083XA Contusion of other part of head, initial encounter: Secondary | ICD-10-CM | POA: Insufficient documentation
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
## 2019-06-13 ENCOUNTER — Emergency Department (HOSPITAL_COMMUNITY): Payer: Self-pay

## 2019-06-13 MED ORDER — DOXYCYCLINE HYCLATE 100 MG PO CAPS: 100.0000 mg | ORAL_CAPSULE | Freq: Two times a day (BID) | ORAL | 0 refills | Status: AC

## 2019-06-13 MED ORDER — BACITRACIN ZINC 500 UNIT/GM EX OINT
TOPICAL_OINTMENT | Freq: Once | CUTANEOUS | Status: AC
  Administered 2019-06-13: 1 via TOPICAL
  Filled 2019-06-13: qty 0.9

## 2019-06-13 MED ORDER — DOXYCYCLINE HYCLATE 100 MG PO TABS: 100.0000 mg | ORAL_TABLET | Freq: Once | ORAL | Status: DC

## 2019-06-13 MED ORDER — TETANUS-DIPHTH-ACELL PERTUSSIS 5-2.5-18.5 LF-MCG/0.5 IM SUSP
0.5000 mL | Freq: Once | INTRAMUSCULAR | Status: AC
  Administered 2019-06-13: 0.5 mL via INTRAMUSCULAR
  Filled 2019-06-13: qty 0.5
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
## 2019-06-13 NOTE — ED Notes (Signed)
System down. Pt unable to sign electronic signature pad.

## 2019-06-13 NOTE — Discharge Instructions (Signed)
You can take Tylenol or Ibuprofen as directed for pain. You can alternate Tylenol and Ibuprofen every 4 hours. If you take Tylenol at 1pm, then you can take Ibuprofen at 5pm. Then you can take Tylenol again at 9pm.   Apply ice to help with pain and swelling.  Take antibiotics as directed. Please take all of your antibiotics until finished.  Follow-up with referred ear nose and throat doctor.  Return the emergency department for any worsening or concerning symptoms, nausea/vomiting, vision changes or any other worsening or concerning symptoms.

## 2019-07-15 ENCOUNTER — Other Ambulatory Visit: Payer: Self-pay

## 2019-07-16 ENCOUNTER — Other Ambulatory Visit: Payer: Self-pay

## 2019-07-16 ENCOUNTER — Ambulatory Visit (INDEPENDENT_AMBULATORY_CARE_PROVIDER_SITE_OTHER): Payer: PRIVATE HEALTH INSURANCE | Admitting: Internal Medicine

## 2019-07-16 ENCOUNTER — Encounter: Payer: Self-pay | Admitting: Internal Medicine

## 2019-07-16 VITALS — BP 113/72 | HR 78 | Temp 97.4°F | Resp 16 | Ht 64.0 in | Wt 168.1 lb

## 2019-07-16 DIAGNOSIS — F339 Major depressive disorder, recurrent, unspecified: Secondary | ICD-10-CM | POA: Diagnosis not present

## 2019-07-16 DIAGNOSIS — S022XXG Fracture of nasal bones, subsequent encounter for fracture with delayed healing: Secondary | ICD-10-CM | POA: Diagnosis not present

## 2019-07-16 DIAGNOSIS — Z0000 Encounter for general adult medical examination without abnormal findings: Secondary | ICD-10-CM | POA: Diagnosis not present

## 2019-07-16 LAB — COMPREHENSIVE METABOLIC PANEL
ALT: 13 U/L (ref 0–35)
AST: 8 U/L (ref 0–37)
Albumin: 4.2 g/dL (ref 3.5–5.2)
Alkaline Phosphatase: 45 U/L (ref 39–117)
BUN: 11 mg/dL (ref 6–23)
CO2: 27 mEq/L (ref 19–32)
Calcium: 9.2 mg/dL (ref 8.4–10.5)
Chloride: 104 mEq/L (ref 96–112)
Creatinine, Ser: 0.84 mg/dL (ref 0.40–1.20)
GFR: 98.96 mL/min (ref >60.00)
Glucose, Bld: 86 mg/dL (ref 70–99)
Potassium: 4 mEq/L (ref 3.5–5.1)
Sodium: 137 mEq/L (ref 135–145)
Total Bilirubin: 0.5 mg/dL (ref 0.2–1.2)
Total Protein: 7.3 g/dL (ref 6.0–8.3)

## 2019-07-16 LAB — TSH: TSH: 0.82 u[IU]/mL (ref 0.35–4.50)

## 2019-07-16 NOTE — Progress Notes (Signed)
Pre visit review using our clinic review tool, if applicable. No additional management support is needed unless otherwise documented below in the visit note. 

## 2019-07-16 NOTE — Patient Instructions (Signed)
GO TO THE LAB : Get the blood work     GO TO THE FRONT DESK Schedule your next appointment   For a check up in 2-3 months    SUICIDE PREVENTION:  Find Help 24/7, [place] Call our 24-hour HelpLine at [phone])041-0919 or [place]: 1-800-SUICIDE   The National Suicide Prevention Lifeline: [employer]

## 2019-07-16 NOTE — Assessment & Plan Note (Signed)
-   Td 2013 -  flu shot d/w pt , declined for now  -female care: saw gyn 08/2018 - diet and exercise -safe sex discussed -warned about substance abuse, +FH of abuse , pt smokes marijuana - Labs reviewed, check a CMP TSH
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
## 2019-09-09 ENCOUNTER — Other Ambulatory Visit: Payer: Self-pay

## 2019-09-10 ENCOUNTER — Ambulatory Visit (INDEPENDENT_AMBULATORY_CARE_PROVIDER_SITE_OTHER): Payer: 59 | Admitting: Obstetrics and Gynecology

## 2019-09-10 ENCOUNTER — Encounter: Payer: Self-pay | Admitting: Obstetrics and Gynecology

## 2019-09-10 VITALS — BP 118/76 | Ht 63.5 in | Wt 164.0 lb

## 2019-09-10 DIAGNOSIS — Z01419 Encounter for gynecological examination (general) (routine) without abnormal findings: Secondary | ICD-10-CM

## 2019-09-10 DIAGNOSIS — N949 Unspecified condition associated with female genital organs and menstrual cycle: Secondary | ICD-10-CM | POA: Diagnosis not present

## 2019-09-10 MED ORDER — ONDANSETRON 4 MG PO TBDP: 4.0000 mg | ORAL_TABLET | Freq: Three times a day (TID) | ORAL | 1 refills | Status: DC | PRN

## 2019-09-10 NOTE — Patient Instructions (Signed)
Please let us know if you would like to pursue the early breast cancer screening because of your family history Try the Zofran during your period to see if it helps your nausea

## 2019-09-10 NOTE — Progress Notes (Signed)
Rebecca Dyer [birth year] [id number]  SUBJECTIVE:  29 y.o. G0P0 female for annual routine gynecologic exam and Pap smear. States her periods have been fine overall, cramps controlled with ibuprofen.  In the past year or so she has noted onset of significant nausea while menstruating, typically only in the morning and residing by the afternoon, and this does not really occur any other time of the month.  Does have history of IBS.    Current Outpatient Medications  Medication Sig Dispense Refill  . amoxicillin (AMOXIL) 500 MG capsule Take 500 mg by mouth 3 (three) times daily.    Marland Kitchen ibuprofen (ADVIL) 800 MG tablet Take 800 mg by mouth every 8 (eight) hours as needed.     No current facility-administered medications for this visit.   Allergies: Duricef [cefadroxil monohydrate]  Patient's last menstrual period was 08/13/2019.  Past medical history,surgical history, problem list, medications, allergies, family history and social history were all reviewed and documented as reviewed in the EPIC chart.  ROS:  Feeling well. No dyspnea or chest pain on exertion.  No abdominal pain, change in bowel habits, black or bloody stools.  No urinary tract symptoms. GYN ROS: normal menses, no abnormal bleeding, pelvic pain or discharge, no breast pain or new or enlarging lumps on self exam. No neurological complaints.    OBJECTIVE:  Ht 5' 3.5" (1.613 m)   Wt 164 lb (74.4 kg)   LMP 08/13/2019   BMI 28.60 kg/m  The patient appears well, alert, oriented x 3, in no distress. ENT normal.  Neck supple. No cervical or supraclavicular adenopathy or thyromegaly.  Lungs are clear, good air entry, no wheezes, rhonchi or rales. S1 and S2 normal, no murmurs, regular rate and rhythm.  Abdomen soft without tenderness, guarding, mass or organomegaly.  Neurological is normal, no focal findings.  BREAST EXAM: breasts appear normal, no suspicious masses, no skin or nipple changes or axillary nodes  PELVIC EXAM:  VULVA: normal appearing vulva with no masses, tenderness or lesions, VAGINA: normal appearing vagina with normal color and discharge, no lesions, CERVIX: normal appearing cervix without discharge or lesions, UTERUS: uterus is normal size, shape, consistency and nontender, ADNEXA: normal adnexa in size, nontender and no masses  Chaperone: Beola Cord present during the examination  ASSESSMENT:  29 y.o. G0P0 here for annual gynecologic exam  PLAN:  1. No current contraception nor sexual activity. Uncertain if she desires conception at this time or not. 2.  Nausea with menses.  We discussed that this sounds to be a hormonal mediated process given the cyclicity of the symptoms.  Recommended trial of contraception to help modulate the typical hormone swings that are part of the natural cycle, in hopes that this may decrease the nausea.  Otherwise I offered to give her a prescription for an antiemetic if she would rather not be on birth control right now.  She would like to try Zofran ODT 4 mg every 8 hours as needed. 3. Pap smear 08/2018 normal. Not repeated today. No prior history of abnormal Pap smears. Next Pap smear due 2023 following the current screening guidelines calling for the 3-year interval. 4.  Family history of breast cancer.  Patient states that her maternal aunt had breast cancer at a very young age, her biologic mother thinks approximately 40s or early 30s was the diagnosis.  The patient herself is adopted.  We discussed that we should strongly consider early onset image based screening in addition to monthly self breast exams and  annual breast exams in the clinic.  She will think about whether or not she wants to pursue the imaging screening and will let us know.  I urged her to let us know as this is recommended. 5. Health maintenance.  No lab work as she has this completed with her primary care provider.  Recent normal TSH 07/2019.  Return annually or sooner, prn.  Larey Days  MD  09/10/19

## 2019-10-11 ENCOUNTER — Other Ambulatory Visit: Payer: Self-pay

## 2019-10-11 ENCOUNTER — Ambulatory Visit (INDEPENDENT_AMBULATORY_CARE_PROVIDER_SITE_OTHER): Payer: PRIVATE HEALTH INSURANCE | Admitting: Internal Medicine

## 2019-10-11 ENCOUNTER — Encounter: Payer: Self-pay | Admitting: Internal Medicine

## 2019-10-11 VITALS — BP 115/66 | HR 80 | Temp 97.8°F | Resp 16 | Ht 64.0 in | Wt 164.4 lb

## 2019-10-11 DIAGNOSIS — F419 Anxiety disorder, unspecified: Secondary | ICD-10-CM

## 2019-10-11 DIAGNOSIS — F339 Major depressive disorder, recurrent, unspecified: Secondary | ICD-10-CM

## 2019-10-11 MED ORDER — ESCITALOPRAM OXALATE 10 MG PO TABS: 10.0000 mg | ORAL_TABLET | Freq: Every day | ORAL | 1 refills | Status: DC

## 2019-10-11 NOTE — Progress Notes (Signed)
   Subjective:    Patient ID: Rebecca Dyer, female    DOB: 10/08/1990, 29 y.o.   MRN: [id number]  DOS:  10/11/2019 Type of visit - description: f/u  CC today is anxiety and also some depression. Denies suicidal or homicidal ideas Sleeping well with occasional nightmare. Still has occasional nausea, she thinks related to episodes of anxiety. Denies vomiting, diarrhea. Occasional red blood in the stools after a BM.    Review of Systems See above   Past Medical History:  Diagnosis Date  . ADHD (attention deficit hyperactivity disorder)   . Depression   . GERD (gastroesophageal reflux disease)   . IBS (irritable bowel syndrome)     Past Surgical History:  Procedure Laterality Date  . pyloric stenosis  1994  . UMBILICAL HERNIA REPAIR  1998   as a child  . WISDOM TOOTH EXTRACTION Bilateral 2010    Allergies as of 10/11/2019      Reactions   Duricef [cefadroxil Monohydrate] Hives      Medication List       Accurate as of October 11, 2019  9:50 AM. If you have any questions, ask your nurse or doctor.        amoxicillin 500 MG capsule Commonly known as: AMOXIL Take 500 mg by mouth 3 (three) times daily.   ibuprofen 800 MG tablet Commonly known as: ADVIL Take 800 mg by mouth every 8 (eight) hours as needed.   ondansetron 4 MG disintegrating tablet Commonly known as: ZOFRAN-ODT Take 1 tablet (4 mg total) by mouth every 8 (eight) hours as needed for nausea or vomiting.         Objective:   Physical Exam BP 115/66 (BP Location: Left Arm, Patient Position: Sitting, Cuff Size: Small)   Pulse 80   Temp 97.8 F (36.6 C) (Temporal)   Resp 16   Ht 5\' 4"  (1.626 m)   Wt 164 lb 6 oz (74.6 kg)   LMP 09/20/2019 (Approximate)   SpO2 100%   BMI 28.21 kg/m  General:   Well developed, NAD, BMI noted. HEENT:  Normocephalic . Face symmetric, atraumatic  Skin: Not pale. Not jaundice Neurologic:  alert & oriented X3.  Speech normal, gait appropriate for age and  unassisted Psych--  Cognition and judgment appear intact.  Cooperative with normal attention span and concentration.  Behavior appropriate. No anxious or depressed appearing.      Assessment    ASSESSMENT H/o Depression, ADD H/o self harm H/o pyloric stenosis 12/2017: nl Cscope, had a EGD  Not on BCP (not active with males) Patient is adopted but knows FH of her biological parents  PLAN: Depression, anxiety: Ongoing symptoms, she think is ready for medication. Currently with no suicidal or homicidal thoughts. She sees a 01/2018 and encouraged to continue with that. I also advised to increase physical activity Medications discussed from SSRIs to BuSpar to Wellbutrin.  She elected SSRIs, Rx Lexapro, warnings about suicidal ideas, see AVS. Nausea: Episodic, patient thinks related to anxiety, reassess on RTC RTC 2 months    This visit occurred during the SARS-CoV-2 public health emergency.  Safety protocols were in place, including screening questions prior to the visit, additional usage of staff PPE, and extensive cleaning of exam room while observing appropriate contact time as indicated for disinfecting solutions.

## 2019-10-11 NOTE — Patient Instructions (Signed)
  GO TO THE FRONT DESK Come back for a check up in 2 months , please make an appointment    SUICIDE PREVENTION:  Find Help 24/7, [place] Call our 24-hour HelpLine at [phone] or [phone]    [employer]: 1-800-SUICIDE   The National Suicide Prevention Lifeline: [employer]

## 2019-10-11 NOTE — Progress Notes (Signed)
Pre visit review using our clinic review tool, if applicable. No additional management support is needed unless otherwise documented below in the visit note. 

## 2019-10-12 NOTE — Assessment & Plan Note (Signed)
Depression, anxiety: Ongoing symptoms, she think is ready for medication. Currently with no suicidal or homicidal thoughts. She sees a Veterinary surgeon and encouraged to continue with that. I also advised to increase physical activity Medications discussed from SSRIs to BuSpar to Wellbutrin.  She elected SSRIs, Rx Lexapro, warnings about suicidal ideas, see AVS. Nausea: Episodic, patient thinks related to anxiety, reassess on RTC RTC 2 months

## 2019-12-14 ENCOUNTER — Ambulatory Visit: Payer: PRIVATE HEALTH INSURANCE | Admitting: Internal Medicine

## 2020-09-11 ENCOUNTER — Encounter: Payer: 59 | Admitting: Obstetrics and Gynecology

## 2020-10-03 ENCOUNTER — Encounter: Payer: Self-pay | Admitting: Family Medicine

## 2020-10-03 ENCOUNTER — Ambulatory Visit (INDEPENDENT_AMBULATORY_CARE_PROVIDER_SITE_OTHER): Payer: PRIVATE HEALTH INSURANCE | Admitting: Family Medicine

## 2020-10-03 ENCOUNTER — Other Ambulatory Visit: Payer: Self-pay

## 2020-10-03 VITALS — BP 110/68 | HR 76 | Temp 98.4°F | Ht 63.0 in | Wt 169.0 lb

## 2020-10-03 DIAGNOSIS — S46819A Strain of other muscles, fascia and tendons at shoulder and upper arm level, unspecified arm, initial encounter: Secondary | ICD-10-CM

## 2020-10-03 DIAGNOSIS — M542 Cervicalgia: Secondary | ICD-10-CM

## 2020-10-03 MED ORDER — MELOXICAM 15 MG PO TABS: 15.0000 mg | ORAL_TABLET | Freq: Every day | ORAL | 0 refills | Status: DC

## 2020-10-03 MED ORDER — TIZANIDINE HCL 4 MG PO TABS: 4.0000 mg | ORAL_TABLET | Freq: Four times a day (QID) | ORAL | 0 refills | Status: DC | PRN

## 2020-10-03 NOTE — Patient Instructions (Addendum)
Heat (pad or rice pillow in microwave) over affected area, 10-15 minutes twice daily.   Ice/cold pack over area for 10-15 min twice daily.  Try to drink 55-60 oz of water daily outside of exercise.  Let us know if you need anything.  Trapezius stretches/exercises Do exercises exactly as told by your health care provider and adjust them as directed. It is normal to feel mild stretching, pulling, tightness, or discomfort as you do these exercises, but you should stop right away if you feel sudden pain or your pain gets worse.  Stretching and range of motion exercises These exercises warm up your muscles and joints and improve the movement and flexibility of your shoulder. These exercises can also help to relieve pain, numbness, and tingling. If you are unable to do any of the following for any reason, do not further attempt to do it.   Exercise A: Flexion, standing    1. Stand and hold a broomstick, a cane, or a similar object. Place your hands a little more than shoulder-width apart on the object. Your left / right hand should be palm-up, and your other hand should be palm-down. 2. Push the stick to raise your left / right arm out to your side and then over your head. Use your other hand to help move the stick. Stop when you feel a stretch in your shoulder, or when you reach the angle that is recommended by your health care provider. ? Avoid shrugging your shoulder while you raise your arm. Keep your shoulder blade tucked down toward your spine. 3. Hold for 30 seconds. 4. Slowly return to the starting position. Repeat 2 times. Complete this exercise 3 times per week.  Exercise B: Abduction, supine    1. Lie on your back and hold a broomstick, a cane, or a similar object. Place your hands a little more than shoulder-width apart on the object. Your left / right hand should be palm-up, and your other hand should be palm-down. 2. Push the stick to raise your left / right arm out to your  side and then over your head. Use your other hand to help move the stick. Stop when you feel a stretch in your shoulder, or when you reach the angle that is recommended by your health care provider. ? Avoid shrugging your shoulder while you raise your arm. Keep your shoulder blade tucked down toward your spine. 3. Hold for 30 seconds. 4. Slowly return to the starting position. Repeat 2 times. Complete this exercise 3 times per week.  Exercise C: Flexion, active-assisted    1. Lie on your back. You may bend your knees for comfort. 2. Hold a broomstick, a cane, or a similar object. Place your hands about shoulder-width apart on the object. Your palms should face toward your feet. 3. Raise the stick and move your arms over your head and behind your head, toward the floor. Use your healthy arm to help your left / right arm move farther. Stop when you feel a gentle stretch in your shoulder, or when you reach the angle where your health care provider tells you to stop. 4. Hold for 30 seconds. 5. Slowly return to the starting position. Repeat 2 times. Complete this exercise 3 times per week.  Exercise D: External rotation and abduction    1. Stand in a door frame with one of your feet slightly in front of the other. This is called a staggered stance. 2. Choose one of the following positions as  told by your health care provider: ? Place your hands and forearms on the door frame above your head. ? Place your hands and forearms on the door frame at the height of your head. ? Place your hands on the door frame at the height of your elbows. 3. Slowly move your weight onto your front foot until you feel a stretch across your chest and in the front of your shoulders. Keep your head and chest upright and keep your abdominal muscles tight. 4. Hold for 30 seconds. 5. To release the stretch, shift your weight to your back foot. Repeat 2 times. Complete this stretch 3 times per week.  Strengthening  exercises These exercises build strength and endurance in your shoulder. Endurance is the ability to use your muscles for a long time, even after your muscles get tired. Exercise E: Scapular depression and adduction  1. Sit on a stable chair. Support your arms in front of you with pillows, armrests, or a tabletop. Keep your elbows in line with the sides of your body. 2. Gently move your shoulder blades down toward your middle back. Relax the muscles on the tops of your shoulders and in the back of your neck. 3. Hold for 3 seconds. 4. Slowly release the tension and relax your muscles completely before doing this exercise again. Repeat for a total of 10 repetitions. 5. After you have practiced this exercise, try doing the exercise without the arm support. Then, try the exercise while standing instead of sitting. Repeat 2 times. Complete this exercise 3 times per week.  Exercise F: Shoulder abduction, isometric    1. Stand or sit about 4-6 inches (10-15 cm) from a wall with your left / right side facing the wall. 2. Bend your left / right elbow and gently press your elbow against the wall. 3. Increase the pressure slowly until you are pressing as hard as you can without shrugging your shoulder. 4. Hold for 3 seconds. 5. Slowly release the tension and relax your muscles completely. Repeat for a total of 10 repetitions. Repeat 2 times. Complete this exercise 3 times per week.  Exercise G: Shoulder flexion, isometric    1. Stand or sit about 4-6 inches (10-15 cm) away from a wall with your left / right side facing the wall. 2. Keep your left / right elbow straight and gently press the top of your fist against the wall. Increase the pressure slowly until you are pressing as hard as you can without shrugging your shoulder. 3. Hold for 10-15 seconds. 4. Slowly release the tension and relax your muscles completely. Repeat for a total of 10 repetitions. Repeat 2 times. Complete this exercise 3  times per week.  Exercise H: Internal rotation    1. Sit in a stable chair without armrests, or stand. Secure an exercise band at your left / right side, at elbow height. 2. Place a soft object, such as a folded towel or a small pillow, under your left / right upper arm so your elbow is a few inches (about 8 cm) away from your side. 3. Hold the end of the exercise band so the band stretches. 4. Keeping your elbow pressed against the soft object under your arm, move your forearm across your body toward your abdomen. Keep your body steady so the movement is only coming from your shoulder. 5. Hold for 3 seconds. 6. Slowly return to the starting position. Repeat for a total of 10 repetitions. Repeat 2 times. Complete this exercise  3 times per week.  Exercise I: External rotation    1. Sit in a stable chair without armrests, or stand. 2. Secure an exercise band at your left / right side, at elbow height. 3. Place a soft object, such as a folded towel or a small pillow, under your left / right upper arm so your elbow is a few inches (about 8 cm) away from your side. 4. Hold the end of the exercise band so the band stretches. 5. Keeping your elbow pressed against the soft object under your arm, move your forearm out, away from your abdomen. Keep your body steady so the movement is only coming from your shoulder. 6. Hold for 3 seconds. 7. Slowly return to the starting position. Repeat for a total of 10 repetitions. Repeat 2 times. Complete this exercise 3 times per week. Exercise J: Shoulder extension  1. Sit in a stable chair without armrests, or stand. Secure an exercise band to a stable object in front of you so the band is at shoulder height. 2. Hold one end of the exercise band in each hand. Your palms should face each other. 3. Straighten your elbows and lift your hands up to shoulder height. 4. Step back, away from the secured end of the exercise band, until the band  stretches. 5. Squeeze your shoulder blades together and pull your hands down to the sides of your thighs. Stop when your hands are straight down by your sides. Do not let your hands go behind your body. 6. Hold for 3 seconds. 7. Slowly return to the starting position. Repeat for a total of 10 repetitions. Repeat 2 times. Complete this exercise 3 times per week.  Exercise K: Shoulder extension, prone    1. Lie on your abdomen on a firm surface so your left / right arm hangs over the edge. 2. Hold a 5 lb weight in your hand so your palm faces in toward your body. Your arm should be straight. 3. Squeeze your shoulder blade down toward the middle of your back. 4. Slowly raise your arm behind you, up to the height of the surface that you are lying on. Keep your arm straight. 5. Hold for 3 seconds. 6. Slowly return to the starting position and relax your muscles. Repeat for a total of 10 repetitions. Repeat 2 times. Complete this exercise 3 times per week.   Exercise L: Horizontal abduction, prone  1. Lie on your abdomen on a firm surface so your left / right arm hangs over the edge. 2. Hold a 5 lb weight in your hand so your palm faces toward your feet. Your arm should be straight. 3. Squeeze your shoulder blade down toward the middle of your back. 4. Bend your elbow so your hand moves up, until your elbow is bent to an "L" shape (90 degrees). With your elbow bent, slowly move your forearm forward and up. Raise your hand up to the height of the surface that you are lying on. ? Your upper arm should not move, and your elbow should stay bent. ? At the top of the movement, your palm should face the floor. 5. Hold for 3 seconds. 6. Slowly return to the starting position and relax your muscles. Repeat for a total of 10 repetitions. Repeat 2 times. Complete this exercise 3 times per week.  Exercise M: Horizontal abduction, standing  1. Sit on a stable chair, or stand. 2. Secure an exercise band  to a stable object in front  of you so the band is at shoulder height. 3. Hold one end of the exercise band in each hand. 4. Straighten your elbows and lift your hands straight in front of you, up to shoulder height. Your palms should face down, toward the floor. 5. Step back, away from the secured end of the exercise band, until the band stretches. 6. Move your arms out to your sides, and keep your arms straight. 7. Hold for 3 seconds. 8. Slowly return to the starting position. Repeat for a total of 10 repetitions. Repeat 2 times. Complete this exercise 3 times per week.  Exercise N: Scapular retraction and elevation  1. Sit on a stable chair, or stand. 2. Secure an exercise band to a stable object in front of you so the band is at shoulder height. 3. Hold one end of the exercise band in each hand. Your palms should face each other. 4. Sit in a stable chair without armrests, or stand. 5. Step back, away from the secured end of the exercise band, until the band stretches. 6. Squeeze your shoulder blades together and lift your hands over your head. Keep your elbows straight. 7. Hold for 3 seconds. 8. Slowly return to the starting position. Repeat for a total of 10 repetitions. Repeat 2 times. Complete this exercise 3 times per week.  This information is not intended to replace advice given to you by your health care provider. Make sure you discuss any questions you have with your health care provider. Document Released: 07/29/2005 Document Revised: 04/04/2016 Document Reviewed: 06/15/2015 [employer] Interactive Patient Education  2017 [employer]  EXERCISES RANGE OF MOTION (ROM) AND STRETCHING EXERCISES  These exercises may help you when beginning to rehabilitate your issue. In order to successfully resolve your symptoms, you must improve your posture. These exercises are designed to help reduce the forward-head and rounded-shoulder posture which contributes to this condition. Your symptoms  may resolve with or without further involvement from your physician, physical therapist or athletic trainer. While completing these exercises, remember:   Restoring tissue flexibility helps normal motion to return to the joints. This allows healthier, less painful movement and activity.  An effective stretch should be held for at least 20 seconds, although you may need to begin with shorter hold times for comfort.  A stretch should never be painful. You should only feel a gentle lengthening or release in the stretched tissue.  Do not do any stretch or exercise that you cannot tolerate.  STRETCH- Axial Extensors  Lie on your back on the floor. You may bend your knees for comfort. Place a rolled-up hand towel or dish towel, about 2 inches in diameter, under the part of your head that makes contact with the floor.  Gently tuck your chin, as if trying to make a "double chin," until you feel a gentle stretch at the base of your head.  Hold 15-20 seconds. Repeat 2-3 times. Complete this exercise 1 time per day.   STRETCH - Axial Extension   Stand or sit on a firm surface. Assume a good posture: chest up, shoulders drawn back, abdominal muscles slightly tense, knees unlocked (if standing) and feet hip width apart.  Slowly retract your chin so your head slides back and your chin slightly lowers. Continue to look straight ahead.  You should feel a gentle stretch in the back of your head. Be certain not to feel an aggressive stretch since this can cause headaches later.  Hold for 15-20 seconds. Repeat 2-3 times.  Complete this exercise 1 time per day.  STRETCH - Cervical Side Bend   Stand or sit on a firm surface. Assume a good posture: chest up, shoulders drawn back, abdominal muscles slightly tense, knees unlocked (if standing) and feet hip width apart.  Without letting your nose or shoulders move, slowly tip your right / left ear to your shoulder until your feel a gentle stretch in the  muscles on the opposite side of your neck.  Hold 15-20 seconds. Repeat 2-3 times. Complete this exercise 1-2 times per day.  STRETCH - Cervical Rotators   Stand or sit on a firm surface. Assume a good posture: chest up, shoulders drawn back, abdominal muscles slightly tense, knees unlocked (if standing) and feet hip width apart.  Keeping your eyes level with the ground, slowly turn your head until you feel a gentle stretch along the back and opposite side of your neck.  Hold 15-20 seconds. Repeat 2-3 times. Complete this exercise 1-2 times per day.  RANGE OF MOTION - Neck Circles   Stand or sit on a firm surface. Assume a good posture: chest up, shoulders drawn back, abdominal muscles slightly tense, knees unlocked (if standing) and feet hip width apart.  Gently roll your head down and around from the back of one shoulder to the back of the other. The motion should never be forced or painful.  Repeat the motion 10-20 times, or until you feel the neck muscles relax and loosen. Repeat 2-3 times. Complete the exercise 1-2 times per day. STRENGTHENING EXERCISES - Cervical Strain and Sprain These exercises may help you when beginning to rehabilitate your injury. They may resolve your symptoms with or without further involvement from your physician, physical therapist, or athletic trainer. While completing these exercises, remember:   Muscles can gain both the endurance and the strength needed for everyday activities through controlled exercises.  Complete these exercises as instructed by your physician, physical therapist, or athletic trainer. Progress the resistance and repetitions only as guided.  You may experience muscle soreness or fatigue, but the pain or discomfort you are trying to eliminate should never worsen during these exercises. If this pain does worsen, stop and make certain you are following the directions exactly. If the pain is still present after adjustments, discontinue  the exercise until you can discuss the trouble with your clinician.  STRENGTH - Cervical Flexors, Isometric  Face a wall, standing about 6 inches away. Place a small pillow, a ball about 6-8 inches in diameter, or a folded towel between your forehead and the wall.  Slightly tuck your chin and gently push your forehead into the soft object. Push only with mild to moderate intensity, building up tension gradually. Keep your jaw and forehead relaxed.  Hold 10 to 20 seconds. Keep your breathing relaxed.  Release the tension slowly. Relax your neck muscles completely before you start the next repetition. Repeat 2-3 times. Complete this exercise 1 time per day.  STRENGTH- Cervical Lateral Flexors, Isometric   Stand about 6 inches away from a wall. Place a small pillow, a ball about 6-8 inches in diameter, or a folded towel between the side of your head and the wall.  Slightly tuck your chin and gently tilt your head into the soft object. Push only with mild to moderate intensity, building up tension gradually. Keep your jaw and forehead relaxed.  Hold 10 to 20 seconds. Keep your breathing relaxed.  Release the tension slowly. Relax your neck muscles completely before you start  the next repetition. Repeat 2-3 times. Complete this exercise 1 time per day.  STRENGTH - Cervical Extensors, Isometric   Stand about 6 inches away from a wall. Place a small pillow, a ball about 6-8 inches in diameter, or a folded towel between the back of your head and the wall.  Slightly tuck your chin and gently tilt your head back into the soft object. Push only with mild to moderate intensity, building up tension gradually. Keep your jaw and forehead relaxed.  Hold 10 to 20 seconds. Keep your breathing relaxed.  Release the tension slowly. Relax your neck muscles completely before you start the next repetition. Repeat 2-3 times. Complete this exercise 1 time per day.  POSTURE AND BODY MECHANICS  CONSIDERATIONS Keeping correct posture when sitting, standing or completing your activities will reduce the stress put on different body tissues, allowing injured tissues a chance to heal and limiting painful experiences. The following are general guidelines for improved posture. Your physician or physical therapist will provide you with any instructions specific to your needs. While reading these guidelines, remember:  The exercises prescribed by your provider will help you have the flexibility and strength to maintain correct postures.  The correct posture provides the optimal environment for your joints to work. All of your joints have less wear and tear when properly supported by a spine with good posture. This means you will experience a healthier, less painful body.  Correct posture must be practiced with all of your activities, especially prolonged sitting and standing. Correct posture is as important when doing repetitive low-stress activities (typing) as it is when doing a single heavy-load activity (lifting).  PROLONGED STANDING WHILE SLIGHTLY LEANING FORWARD When completing a task that requires you to lean forward while standing in one place for a long time, place either foot up on a stationary 2- to 4-inch high object to help maintain the best posture. When both feet are on the ground, the low back tends to lose its slight inward curve. If this curve flattens (or becomes too large), then the back and your other joints will experience too much stress, fatigue more quickly, and can cause pain.   RESTING POSITIONS Consider which positions are most painful for you when choosing a resting position. If you have pain with flexion-based activities (sitting, bending, stooping, squatting), choose a position that allows you to rest in a less flexed posture. You would want to avoid curling into a fetal position on your side. If your pain worsens with extension-based activities (prolonged standing,  working overhead), avoid resting in an extended position such as sleeping on your stomach. Most people will find more comfort when they rest with their spine in a more neutral position, neither too rounded nor too arched. Lying on a non-sagging bed on your side with a pillow between your knees, or on your back with a pillow under your knees will often provide some relief. Keep in mind, being in any one position for a prolonged period of time, no matter how correct your posture, can still lead to stiffness.  WALKING Walk with an upright posture. Your ears, shoulders, and hips should all line up. OFFICE WORK When working at a desk, create an environment that supports good, upright posture. Without extra support, muscles fatigue and lead to excessive strain on joints and other tissues.  CHAIR:  A chair should be able to slide under your desk when your back makes contact with the back of the chair. This allows you to  work closely.  The chair's height should allow your eyes to be level with the upper part of your monitor and your hands to be slightly lower than your elbows.  Body position: ? Your feet should make contact with the floor. If this is not possible, use a foot rest. ? Keep your ears over your shoulders. This will reduce stress on your neck and low back.

## 2020-10-03 NOTE — Progress Notes (Signed)
Musculoskeletal Exam  Patient: Rebecca Dyer DOB: 1991-01-02  DOS: 10/03/2020  SUBJECTIVE:  Chief Complaint:   Chief Complaint  Patient presents with  . Neck Pain    Back pain     Rebecca Dyer is a 30 y.o.  female for evaluation and treatment of neck/shoulder pain.   Onset:  2 weeks ago. No inj or change in activity.  Location: b/l neck and upper back/shoulders Character:  aching and burning  Progression of issue:  has worsened Associated symptoms: decreased ROM of neck; no redness, bruising or swelling Treatment: to date has been muscle relaxers and [place].    Neurovascular symptoms: no  Past Medical History:  Diagnosis Date  . ADHD (attention deficit hyperactivity disorder)   . Depression   . GERD (gastroesophageal reflux disease)   . IBS (irritable bowel syndrome)     Objective: VITAL SIGNS: BP 110/68 (BP Location: Left Arm, Patient Position: Sitting, Cuff Size: Normal)   Pulse 76   Temp 98.4 F (36.9 C) (Oral)   Ht 5\' 3"  (1.6 m)   Wt 169 lb (76.7 kg)   SpO2 98%   BMI 29.94 kg/m  Constitutional: Well formed, well developed. No acute distress. Thorax & Lungs: No accessory muscle use Musculoskeletal: neck/shoulders.   Normal active range of motion: no.   Normal passive range of motion: no Tenderness to palpation: Yes, over the lateral neck musculature bilaterally and bilateral trapezius worse than the left; no bony tenderness Deformity: no Ecchymosis: no Tests positive: None Tests negative: Spurling's Neurologic: Normal sensory function. No focal deficits noted. DTR's equal and symmetric in UE's. No clonus. Psychiatric: Normal mood. Age appropriate judgment and insight. Alert & oriented x 3.    Assessment:  Strain of trapezius muscle, unspecified laterality, initial encounter - Plan: meloxicam (MOBIC) 15 MG tablet, tiZANidine (ZANAFLEX) 4 MG tablet  Neck pain - Plan: meloxicam (MOBIC) 15 MG tablet, tiZANidine (ZANAFLEX) 4 MG  tablet  Plan: Stretches/exercises, heat, ice, Tylenol.  Mobic daily.  Zanaflex as needed.  Could consider trigger point injections if no improvement.  Physical therapy versus sports medicine also possibility. F/u as originally scheduled regular PCP. The patient voiced understanding and agreement to the plan.   [place], DO 10/03/20  4:52 PM

## 2020-10-19 ENCOUNTER — Encounter: Payer: Self-pay | Admitting: Internal Medicine

## 2020-10-25 ENCOUNTER — Encounter: Payer: Self-pay | Admitting: Internal Medicine

## 2020-10-25 ENCOUNTER — Telehealth: Payer: Self-pay

## 2020-10-25 ENCOUNTER — Ambulatory Visit (INDEPENDENT_AMBULATORY_CARE_PROVIDER_SITE_OTHER): Payer: PRIVATE HEALTH INSURANCE | Admitting: Internal Medicine

## 2020-10-25 ENCOUNTER — Ambulatory Visit: Payer: PRIVATE HEALTH INSURANCE | Admitting: Internal Medicine

## 2020-10-25 ENCOUNTER — Other Ambulatory Visit: Payer: Self-pay

## 2020-10-25 VITALS — BP 98/72 | HR 72 | Temp 97.6°F | Resp 16 | Ht 63.0 in | Wt 170.2 lb

## 2020-10-25 DIAGNOSIS — S29012A Strain of muscle and tendon of back wall of thorax, initial encounter: Secondary | ICD-10-CM | POA: Diagnosis not present

## 2020-10-25 NOTE — Patient Instructions (Addendum)
Please schedule a physical exam at your earliest convenience.   We will refer you to physical therapy  Okay to take the muscle relaxant tizanidine, if it make you sleepy only take it at night  For days you are not hurting much okay to take Tylenol  For days you have more pain, okay to take meloxicam 1 tablet daily  as   Always take it with food because may cause gastritis and ulcers.  If you notice nausea, stomach pain, change in the color of stools --->  Stop the medicine and let us know  If you are not gradually better, please let us know.

## 2020-10-25 NOTE — Progress Notes (Unsigned)
Subjective:    Patient ID: Rebecca Dyer, female    DOB: 01/07/1991, 30 y.o.   MRN: [id number]  DOS:  10/25/2020 Type of visit - description: Acute Symptoms a started approximately 09/21/2020, no injury. Was seen by Dr. Carmelia Roller 10/03/2020, symptoms were neck and upper back pain. She was prescribed meloxicam and tizanidine, took it for a couple of days, they did help. She also started self physical therapy.  She is back because although the neck pain feels better, she still have a sharp pain between the shoulder blades.  denies any paresthesias at the upper or lower extremity    Review of Systems No fever chills Occasionally has mild cough but nothing persistent  Past Medical History:  Diagnosis Date  . ADHD (attention deficit hyperactivity disorder)   . Depression   . GERD (gastroesophageal reflux disease)   . IBS (irritable bowel syndrome)     Past Surgical History:  Procedure Laterality Date  . pyloric stenosis  1994  . UMBILICAL HERNIA REPAIR  1998   as a child  . WISDOM TOOTH EXTRACTION Bilateral 2010    Allergies as of 10/25/2020      Reactions   Duricef [cefadroxil Monohydrate] Hives      Medication List       Accurate as of October 25, 2020 11:59 PM. If you have any questions, ask your nurse or doctor.        STOP taking these medications   escitalopram 10 MG tablet Commonly known as: LEXAPRO Stopped by: Willow Ora, MD     TAKE these medications   meloxicam 15 MG tablet Commonly known as: MOBIC Take 1 tablet (15 mg total) by mouth daily.   tiZANidine 4 MG tablet Commonly known as: Zanaflex Take 1 tablet (4 mg total) by mouth every 6 (six) hours as needed for muscle spasms.          Objective:   Physical Exam BP 98/72 (BP Location: Left Arm, Patient Position: Sitting, Cuff Size: Small)   Pulse 72   Temp 97.6 F (36.4 C) (Oral)   Resp 16   Ht 5\' 3"  (1.6 m)   Wt 170 lb 4 oz (77.2 kg)   SpO2 98%   BMI 30.16 kg/m  General:   Well  developed, NAD, BMI noted. HEENT:  Normocephalic . Face symmetric, atraumatic line  MSK: No TTP at the cervical or lumbar spine. Slightly TTP at the upper T-spine. Shoulders: Range of motion normal bilaterally. Lower extremities: no pretibial edema bilaterally  Skin: Not pale. Not jaundice Neurologic:  alert & oriented X3.  Speech normal, gait appropriate for age and unassisted.  Motor and DTR symmetric Psych--  Cognition and judgment appear intact.  Cooperative with normal attention span and concentration.  Behavior appropriate. No anxious or depressed appearing.      Assessment     ASSESSMENT H/o Depression, ADD H/o self harm H/o pyloric stenosis 12/2017: nl Cscope, had a EGD  Not on BCP (not active with males) Patient is adopted but knows FH of her biological parents  PLAN: Strain, upper back: Symptoms started few weeks ago, initially had neck pain, that seems better but she still have pain between the shoulder blades. Neurological exam is normal. Recommend to see PT, referral sent. Pain control with Tylenol and meloxicam if needed, GI precautions discussed, see AVS. Okay to take the muscle relaxant. If above is not working she will let me know.     This visit occurred during the SARS-CoV-2 public  health emergency.  Safety protocols were in place, including screening questions prior to the visit, additional usage of staff PPE, and extensive cleaning of exam room while observing appropriate contact time as indicated for disinfecting solutions.

## 2020-10-25 NOTE — Telephone Encounter (Signed)
Caller states her wife needs to see if she can get a later appt due to not feeling.  Telephone: [phone]

## 2020-10-26 ENCOUNTER — Other Ambulatory Visit: Payer: Self-pay | Admitting: Family Medicine

## 2020-10-26 ENCOUNTER — Telehealth: Payer: Self-pay | Admitting: Internal Medicine

## 2020-10-26 DIAGNOSIS — M542 Cervicalgia: Secondary | ICD-10-CM

## 2020-10-26 DIAGNOSIS — S46819A Strain of other muscles, fascia and tendons at shoulder and upper arm level, unspecified arm, initial encounter: Secondary | ICD-10-CM

## 2020-10-26 NOTE — Telephone Encounter (Signed)
Yes, try another location.

## 2020-10-26 NOTE — Telephone Encounter (Signed)
Caller: Toniann Fail ([facility] Call back [phone]  FYI  Want to let you know patient insurance will not cover PT or OT

## 2020-10-26 NOTE — Telephone Encounter (Signed)
FYI. I can try another place.

## 2020-10-26 NOTE — Telephone Encounter (Signed)
Referral changed to [facility].

## 2020-10-27 NOTE — Assessment & Plan Note (Signed)
Strain, upper back: Symptoms started few weeks ago, initially had neck pain, that seems better but she still have pain between the shoulder blades. Neurological exam is normal. Recommend to see PT, referral sent. Pain control with Tylenol and meloxicam if needed, GI precautions discussed, see AVS. Okay to take the muscle relaxant. If above is not working she will let me know.

## 2020-11-03 ENCOUNTER — Telehealth: Payer: Self-pay

## 2020-11-03 NOTE — Telephone Encounter (Signed)
Plan of care signed and faxed back to [facility] at [phone]. Form sent for scanning.  

## 2020-12-11 ENCOUNTER — Telehealth: Payer: Self-pay

## 2020-12-11 NOTE — Telephone Encounter (Signed)
Plan of care signed and faxed back to [facility] at [phone]. Form sent for scanning.  

## 2021-01-31 ENCOUNTER — Telehealth: Payer: Self-pay

## 2021-01-31 NOTE — Telephone Encounter (Signed)
Plan of care signed and faxed back to [facility] at [phone]. Form sent for scanning.  

## 2021-02-01 ENCOUNTER — Telehealth: Payer: Self-pay | Admitting: Internal Medicine

## 2021-02-01 NOTE — Telephone Encounter (Signed)
   Patient requesting [facility] appointment with Dr Lawerance Bach Her mother Kyndal Heringer is also a patient here

## 2021-02-01 NOTE — Telephone Encounter (Signed)
Okay with me 

## 2021-02-01 NOTE — Telephone Encounter (Signed)
I will accept her 

## 2021-02-21 NOTE — Telephone Encounter (Signed)
   Called patient, left message to call office for [facility] appointment

## 2021-03-05 ENCOUNTER — Telehealth: Payer: Self-pay

## 2021-03-06 NOTE — Telephone Encounter (Signed)
error 

## 2021-04-09 NOTE — Progress Notes (Signed)
Subjective:    Patient ID: Rebecca Dyer, female    DOB: 09-Mar-1991, 30 y.o.   MRN: [id number]   This visit occurred during the SARS-CoV-2 public health emergency.  Safety protocols were in place, including screening questions prior to the visit, additional usage of staff PPE, and extensive cleaning of exam room while observing appropriate contact time as indicated for disinfecting solutions.    HPI She is here to establish with a new pcp.  She is here for a physical exam.    No concerns.     Medications and allergies reviewed with patient and updated if appropriate.  Patient Active Problem List   Diagnosis Date Noted   History of depression 04/10/2021   Internal hemorrhoids 06/05/2017   Gastroesophageal reflux disease 06/05/2017   PCP NOTES >>>>>>>>>>>>>>>>>>>> 05/26/2017   ADHD (attention deficit hyperactivity disorder) 09/11/2012   Depression, recurrent (HCC) 05/14/2012   Pyloric stenosis 04/16/2011    No current outpatient medications on file prior to visit.   No current facility-administered medications on file prior to visit.    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   Tobacco Use   Smoking status: Former   Smokeless tobacco: Never  Building services engineer Use: Never used  Substance and Sexual Activity   Alcohol use: Yes    Comment: socially   Drug use: Yes    Types: Marijuana    Sexual activity: Not Currently    Birth control/protection: Condom    Comment: 1st intercourse 30 yo-More than 5 partners  Other Topics Concern   Not on file  Social History Narrative   Sexual preference: females    Did one year of college   Household:   At home is the patient & her girlfriend     Social Determinants of Corporate investment banker Strain: Not on file  Food Insecurity: Not on file  Transportation Needs: Not on file  Physical Activity: Not on file  Stress: Not on file  Social Connections: Not on file    Family History  Adopted: Yes  Problem Relation Age of Onset   Hypertension Mother    Drug abuse Mother    Drug abuse Father    Breast cancer Maternal Aunt        age unknown   Colon cancer Neg Hx     Review of Systems  Constitutional:  Negative for chills, fatigue and fever.  Eyes:  Negative for visual disturbance.       Eyes cross when looking at something for a while - better with wearing sunglasses  Respiratory:  Negative for cough, shortness of breath and wheezing.   Cardiovascular:  Negative for chest pain, palpitations and leg swelling.  Gastrointestinal:  Negative for abdominal pain, blood in stool, constipation, diarrhea and nausea.  Rare gerd  Genitourinary:  Negative for dysuria.  Musculoskeletal:  Positive for back pain (chronic) and neck pain. Negative for arthralgias.       Clavicle on left pops out at times  Skin:  Negative for rash.  Neurological:  Negative for light-headedness and headaches.  Psychiatric/Behavioral:  Negative for dysphoric mood. The patient is nervous/anxious.       Objective:   Vitals:   04/10/21 1407  BP: 102/76  Pulse: 79  Temp: 98.8 F (37.1 C)  SpO2: 99%   Filed Weights   04/10/21 1407  Weight: 161 lb (73 kg)   Body mass index is 28.52 kg/m.  BP Readings from Last 3 Encounters:  04/10/21 102/76  10/25/20 98/72  10/03/20 110/68    Wt Readings from Last 3 Encounters:  04/10/21 161 lb (73  kg)  10/25/20 170 lb 4 oz (77.2 kg)  10/03/20 169 lb (76.7 kg)    Depression screen [facility] 2/9 04/10/2021 07/16/2019 06/24/2018  Decreased Interest 0 1 1  Down, Depressed, Hopeless 0 2 2  PHQ - 2 Score 0 3 3  Altered sleeping 1 1 2   Tired, decreased energy 1 1 1   Change in appetite 1 1 3   Feeling bad or failure about yourself  0 2 1  Trouble concentrating 0 3 0  Moving slowly or fidgety/restless 0 0 2  Suicidal thoughts 0 1 1  PHQ-9 Score 3 12 13   Difficult doing work/chores Not difficult at all Somewhat difficult -    GAD 7 : Generalized Anxiety Score 04/10/2021  Nervous, Anxious, on Edge 0  Control/stop worrying 0  Worry too much - different things 1  Trouble relaxing 1  Restless 1  Easily annoyed or irritable 0  Afraid - awful might happen 0  Total GAD 7 Score 3  Anxiety Difficulty Not difficult at all        Physical Exam Constitutional: She appears well-developed and well-nourished. No distress.  HENT:  Head: Normocephalic and atraumatic.  Right Ear: External ear normal. Normal ear canal and TM Left Ear: External ear normal.  Normal ear canal and TM Mouth/Throat: Oropharynx is clear and moist.  Eyes: Conjunctivae and EOM are normal.  Neck: Neck supple. No tracheal deviation present. No thyromegaly present.  No carotid bruit  Cardiovascular: Normal rate, regular rhythm and normal heart sounds.   No murmur heard.  No edema. Pulmonary/Chest: Effort normal and breath sounds normal. No respiratory distress. She has no wheezes. She has no rales.  Breast: deferred   Abdominal: Soft. She exhibits no distension. There is no tenderness.  Lymphadenopathy: She has no cervical adenopathy.  Skin: Skin is warm and dry. She is not diaphoretic.  Psychiatric: She has a normal mood and affect. Her behavior is normal.     Lab Results  Component Value Date   WBC 6.3 09/03/2018   HGB 12.1 09/03/2018   HCT 35.1 09/03/2018   PLT 289 09/03/2018   GLUCOSE 86 07/16/2019   CHOL 140  07/02/2018   TRIG 34.0 07/02/2018   HDL 44.30 07/02/2018   LDLCALC 89 07/02/2018   ALT 13 07/16/2019   AST 8 07/16/2019   NA 137 07/16/2019   K 4.0 07/16/2019   CL 104 07/16/2019   CREATININE 0.84 07/16/2019   BUN 11 07/16/2019   CO2 27 07/16/2019   TSH 0.82 07/16/2019         Assessment & Plan:   Physical exam: Screening blood work  ordered Exercise  line dancing 1-3/week Weight  ok for age Substance abuse  none   Screened for depression using the PHQ 9 scale.  No evidence of depression.    Reviewed recommended immunizations.   Health Maintenance  Topic Date Due   COVID-19 Vaccine (3 - Booster for Pfizer series) 07/13/2020   INFLUENZA VACCINE  03/12/2021   PAP-Cervical Cytology Screening  09/03/2021   TETANUS/TDAP  06/12/2029   HPV VACCINES  Completed   Hepatitis C Screening  Completed   HIV Screening  Completed   Pneumococcal Vaccine 1-41 Years old  Aged Out          See Problem List for Assessment and Plan of chronic medical problems.

## 2021-04-10 ENCOUNTER — Ambulatory Visit (INDEPENDENT_AMBULATORY_CARE_PROVIDER_SITE_OTHER): Payer: PRIVATE HEALTH INSURANCE | Admitting: Internal Medicine

## 2021-04-10 ENCOUNTER — Other Ambulatory Visit: Payer: Self-pay

## 2021-04-10 ENCOUNTER — Encounter: Payer: Self-pay | Admitting: Internal Medicine

## 2021-04-10 VITALS — BP 102/76 | HR 79 | Temp 98.8°F | Ht 63.0 in | Wt 161.0 lb

## 2021-04-10 DIAGNOSIS — Z0000 Encounter for general adult medical examination without abnormal findings: Secondary | ICD-10-CM

## 2021-04-10 DIAGNOSIS — M25511 Pain in right shoulder: Secondary | ICD-10-CM | POA: Diagnosis not present

## 2021-04-10 DIAGNOSIS — Z8719 Personal history of other diseases of the digestive system: Secondary | ICD-10-CM | POA: Insufficient documentation

## 2021-04-10 DIAGNOSIS — Z833 Family history of diabetes mellitus: Secondary | ICD-10-CM | POA: Diagnosis not present

## 2021-04-10 DIAGNOSIS — Z1331 Encounter for screening for depression: Secondary | ICD-10-CM | POA: Diagnosis not present

## 2021-04-10 DIAGNOSIS — Z8659 Personal history of other mental and behavioral disorders: Secondary | ICD-10-CM | POA: Insufficient documentation

## 2021-04-10 DIAGNOSIS — M542 Cervicalgia: Secondary | ICD-10-CM

## 2021-04-10 DIAGNOSIS — G8929 Other chronic pain: Secondary | ICD-10-CM

## 2021-04-10 DIAGNOSIS — M898X1 Other specified disorders of bone, shoulder: Secondary | ICD-10-CM

## 2021-04-10 LAB — COMPREHENSIVE METABOLIC PANEL
ALT: 13 U/L (ref 0–35)
AST: 15 U/L (ref 0–37)
Albumin: 4 g/dL (ref 3.5–5.2)
Alkaline Phosphatase: 41 U/L (ref 39–117)
BUN: 11 mg/dL (ref 6–23)
CO2: 27 mEq/L (ref 19–32)
Calcium: 9.3 mg/dL (ref 8.4–10.5)
Chloride: 105 mEq/L (ref 96–112)
Creatinine, Ser: 0.9 mg/dL (ref 0.40–1.20)
GFR: 87.31 mL/min (ref >60.00)
Glucose, Bld: 84 mg/dL (ref 70–99)
Potassium: 4 mEq/L (ref 3.5–5.1)
Sodium: 138 mEq/L (ref 135–145)
Total Bilirubin: 0.6 mg/dL (ref 0.2–1.2)
Total Protein: 7.2 g/dL (ref 6.0–8.3)

## 2021-04-10 LAB — TSH: TSH: 0.66 u[IU]/mL (ref 0.35–5.50)

## 2021-04-10 LAB — CBC WITH DIFFERENTIAL/PLATELET
Basophils Absolute: 0 10*3/uL (ref 0.0–0.1)
Basophils Relative: 0.8 % (ref 0.0–3.0)
Eosinophils Absolute: 0.2 10*3/uL (ref 0.0–0.7)
Eosinophils Relative: 3.8 % (ref 0.0–5.0)
HCT: 37 % (ref 36.0–46.0)
Hemoglobin: 12.7 g/dL (ref 12.0–15.0)
Lymphocytes Relative: 40.8 % (ref 12.0–46.0)
Lymphs Abs: 2.4 10*3/uL (ref 0.7–4.0)
MCHC: 34.3 g/dL (ref 30.0–36.0)
MCV: 97.8 fl (ref 78.0–100.0)
Monocytes Absolute: 0.4 10*3/uL (ref 0.1–1.0)
Monocytes Relative: 6.6 % (ref 3.0–12.0)
Neutro Abs: 2.9 10*3/uL (ref 1.4–7.7)
Neutrophils Relative %: 48 % (ref 43.0–77.0)
Platelets: 256 10*3/uL (ref 150.0–400.0)
RBC: 3.79 Mil/uL — ABNORMAL LOW (ref 3.87–5.11)
RDW: 12.8 % (ref 11.5–15.5)
WBC: 6 10*3/uL (ref 4.0–10.5)

## 2021-04-10 LAB — LIPID PANEL
Cholesterol: 131 mg/dL (ref 0–200)
HDL: 45.3 mg/dL (ref >39.00)
LDL Cholesterol: 77 mg/dL (ref 0–99)
NonHDL: 85.67
Total CHOL/HDL Ratio: 3
Triglycerides: 45 mg/dL (ref 0.0–149.0)
VLDL: 9 mg/dL (ref 0.0–40.0)

## 2021-04-10 LAB — HEMOGLOBIN A1C: Hgb A1c MFr Bld: 4.9 % (ref 4.6–6.5)

## 2021-04-10 NOTE — Assessment & Plan Note (Signed)
Check a1c 

## 2021-04-10 NOTE — Assessment & Plan Note (Signed)
H/o depression Denies depression now PHQ9  Score neg for depression Will monitor

## 2021-04-10 NOTE — Patient Instructions (Addendum)
Blood work was ordered.     Medications changes include :   none   A referral was ordered for sports medicine - dr Katrinka Blazing.       Someone from their office will call you to schedule an appointment.      Health Maintenance, Female Adopting a healthy lifestyle and getting preventive care are important in promoting health and wellness. Ask your health care provider about: The right schedule for you to have regular tests and exams. Things you can do on your own to prevent diseases and keep yourself healthy. What should I know about diet, weight, and exercise? Eat a healthy diet  Eat a diet that includes plenty of vegetables, fruits, low-fat dairy products, and lean protein. Do not eat a lot of foods that are high in solid fats, added sugars, or sodium.  Maintain a healthy weight Body mass index (BMI) is used to identify weight problems. It estimates body fat based on height and weight. Your health care provider can help determineyour BMI and help you achieve or maintain a healthy weight. Get regular exercise Get regular exercise. This is one of the most important things you can do for your health. Most adults should: Exercise for at least 150 minutes each week. The exercise should increase your heart rate and make you sweat (moderate-intensity exercise). Do strengthening exercises at least twice a week. This is in addition to the moderate-intensity exercise. Spend less time sitting. Even light physical activity can be beneficial. Watch cholesterol and blood lipids Have your blood tested for lipids and cholesterol at 28 years of age, then havethis test every 5 years. Have your cholesterol levels checked more often if: Your lipid or cholesterol levels are high. You are older than 28 years of age. You are at high risk for heart disease. What should I know about cancer screening? Depending on your health history and family history, you may need to have cancer screening at various ages.  This may include screening for: Breast cancer. Cervical cancer. Colorectal cancer. Skin cancer. Lung cancer. What should I know about heart disease, diabetes, and high blood pressure? Blood pressure and heart disease High blood pressure causes heart disease and increases the risk of stroke. This is more likely to develop in people who have high blood pressure readings, are of African descent, or are overweight. Have your blood pressure checked: Every 3-5 years if you are 10-55 years of age. Every year if you are 23 years old or older. Diabetes Have regular diabetes screenings. This checks your fasting blood sugar level. Have the screening done: Once every three years after age 77 if you are at a normal weight and have a low risk for diabetes. More often and at a younger age if you are overweight or have a high risk for diabetes. What should I know about preventing infection? Hepatitis B If you have a higher risk for hepatitis B, you should be screened for this virus. Talk with your health care provider to find out if you are at risk forhepatitis B infection. Hepatitis C Testing is recommended for: Everyone born from [birth year] through 1963. Anyone with known risk factors for hepatitis C. Sexually transmitted infections (STIs) Get screened for STIs, including gonorrhea and chlamydia, if: You are sexually active and are younger than 28 years of age. You are older than 28 years of age and your health care provider tells you that you are at risk for this type of infection. Your sexual activity has changed since  you were last screened, and you are at increased risk for chlamydia or gonorrhea. Ask your health care provider if you are at risk. Ask your health care provider about whether you are at high risk for HIV. Your health care provider may recommend a prescription medicine to help prevent HIV infection. If you choose to take medicine to prevent HIV, you should first get tested for HIV. You  should then be tested every 3 months for as long as you are taking the medicine. Pregnancy If you are about to stop having your period (premenopausal) and you may become pregnant, seek counseling before you get pregnant. Take 400 to 800 micrograms (mcg) of folic acid every day if you become pregnant. Ask for birth control (contraception) if you want to prevent pregnancy. Osteoporosis and menopause Osteoporosis is a disease in which the bones lose minerals and strength with aging. This can result in bone fractures. If you are 39 years old or older, or if you are at risk for osteoporosis and fractures, ask your health care provider if you should: Be screened for bone loss. Take a calcium or vitamin D supplement to lower your risk of fractures. Be given hormone replacement therapy (HRT) to treat symptoms of menopause. Follow these instructions at home: Lifestyle Do not use any products that contain nicotine or tobacco, such as cigarettes, e-cigarettes, and chewing tobacco. If you need help quitting, ask your health care provider. Do not use street drugs. Do not share needles. Ask your health care provider for help if you need support or information about quitting drugs. Alcohol use Do not drink alcohol if: Your health care provider tells you not to drink. You are pregnant, may be pregnant, or are planning to become pregnant. If you drink alcohol: Limit how much you use to 0-1 drink a day. Limit intake if you are breastfeeding. Be aware of how much alcohol is in your drink. In the [place] one drink equals one 12 oz bottle of beer (355 mL), one 5 oz glass of wine (148 mL), or one 1 oz glass of hard liquor (44 mL). General instructions Schedule regular health, dental, and eye exams. Stay current with your vaccines. Tell your health care provider if: You often feel depressed. You have ever been abused or do not feel safe at home. Summary Adopting a healthy lifestyle and getting preventive care  are important in promoting health and wellness. Follow your health care provider's instructions about healthy diet, exercising, and getting tested or screened for diseases. Follow your health care provider's instructions on monitoring your cholesterol and blood pressure. This information is not intended to replace advice given to you by your health care provider. Make sure you discuss any questions you have with your healthcare provider. Document Revised: 07/22/2018 Document Reviewed: 07/22/2018 Elsevier Patient Education  2022 [employer].

## 2021-04-30 ENCOUNTER — Ambulatory Visit (INDEPENDENT_AMBULATORY_CARE_PROVIDER_SITE_OTHER): Payer: PRIVATE HEALTH INSURANCE

## 2021-04-30 ENCOUNTER — Other Ambulatory Visit: Payer: Self-pay

## 2021-04-30 ENCOUNTER — Ambulatory Visit (INDEPENDENT_AMBULATORY_CARE_PROVIDER_SITE_OTHER): Payer: PRIVATE HEALTH INSURANCE | Admitting: Sports Medicine

## 2021-04-30 VITALS — BP 120/78 | HR 61 | Wt 167.0 lb

## 2021-04-30 DIAGNOSIS — M25511 Pain in right shoulder: Secondary | ICD-10-CM | POA: Diagnosis not present

## 2021-04-30 DIAGNOSIS — M898X1 Other specified disorders of bone, shoulder: Secondary | ICD-10-CM

## 2021-04-30 DIAGNOSIS — G2589 Other specified extrapyramidal and movement disorders: Secondary | ICD-10-CM

## 2021-04-30 NOTE — Patient Instructions (Addendum)
PT referral Do prescribed exercises at least 3x a week See you again in 4-6 weeks

## 2021-04-30 NOTE — Progress Notes (Signed)
    Aleen Sells D.[facility] [address] [place] [address] Phone: [phone]   Assessment and Plan:     2. Right shoulder pain, unspecified chronicity 3. Scapular dyskinesis -Chronic, unchanged, initial sports medicine visit - Likely scapular dyskinesis based on HPI, physical exam, unremarkable x-rays - X-ray performed in clinic.  My interpretation: No acute fracture.  No dislocation.  No cortical irregularities.  Unremarkable x-ray - Start HEP and physical therapy focused on scapular dyskinesis - May use Tylenol/NSAIDs as needed for pain control Ambulatory referral to Physical Therapy   1. Pain of left clavicle -Chronic, unchanged, initial sports medicine visit - Unclear etiology at this time.  Likely a compensation injury with chronic right shoulder pain.  Patient may be experiencing left [place] subluxation based on symptoms, however no [place] joint laxity felt on physical exam, no evidence of [place] joint dislocation on x-ray, no red flag symptoms including no shortness of breath/chest pain/difficulty swallowing/neck swelling - X-ray obtained in clinic.  My interpretation: No acute fracture, [place] joint and AC joint appear approximated. - DG Clavicle Left; Future    Pertinent previous records reviewed include past to primary care office notes, last 3 PT telephone encounters   Follow Up: In 4 to 6 weeks for reevaluation.  Would consider advanced imaging at that time.  Follow-up sooner if symptoms worsening.   Subjective:    Chief Complaint: Chronic pain in right scapula characterized as sharp. Pain in left clavicle sharp over [place] joint.  HPI:   04/30/21 Patient presents with chronic pain in the right scapula over the past 1 year.  Denies any known mechanism of injury.  States that pain presented slowly and progressively got worse, mostly located in her shoulder blade.  Patient tried physical therapy which was improving her symptoms until she began  having left [place] joint pain around 11/2020.  She says that her PT told her her [place] joint was dislocating so they backed off on her physical therapy.  Patient has since stopped physical therapy and is looking for a new PT if we believe it is indicated.  No history of trauma, falls.  No chest pain, shortness of breath, swelling along clavicle or [place] joint.  Denies numbness/tingling/weakness in extremities.  Does not want to take medication on a regular basis for these conditions.  Relevant Historical Information: None pertinent  Additional pertinent review of systems negative.  No current outpatient medications on file.   Objective:     Vitals:   04/30/21 0937  BP: 120/78  Pulse: 61  SpO2: 99%  Weight: 167 lb (75.8 kg)      Body mass index is 29.58 kg/m.    Physical Exam:    Gen: Appears well, nad, nontoxic and pleasant Neuro:sensation intact, strength is 5/5 with df/pf/inv/ev, muscle tone wnl Skin: no suspicious lesion or defmority Psych: A&O, appropriate mood and affect  Right and left shoulder: no deformity, swelling or muscle wasting Positive scapular winging on right FF 180, abd 180, int 0, ext 90 TTP rhomboids on right, [place] joint and clavicle on left without laxity NTTP over  clavicle, ac, coracoid, biceps groove, humerus, deltoid, subacromial space, scap spine, musculature, trap, cervical spine Neg neer, hawkings, empty can, subscap liftoff, speeds, obriens Neg ant drawer, sulcus sign Neg apprehension Negative Spurling's test bilat FROM of neck    Electronically signed by:  Aleen Sells D.[facility] 10:59 AM 04/30/21

## 2021-05-28 ENCOUNTER — Ambulatory Visit: Payer: Self-pay

## 2021-05-28 ENCOUNTER — Ambulatory Visit (INDEPENDENT_AMBULATORY_CARE_PROVIDER_SITE_OTHER): Payer: PRIVATE HEALTH INSURANCE | Admitting: Sports Medicine

## 2021-05-28 ENCOUNTER — Other Ambulatory Visit: Payer: Self-pay

## 2021-05-28 VITALS — BP 112/84 | HR 80 | Ht 63.0 in | Wt 167.0 lb

## 2021-05-28 DIAGNOSIS — M25511 Pain in right shoulder: Secondary | ICD-10-CM | POA: Diagnosis not present

## 2021-05-28 DIAGNOSIS — G2589 Other specified extrapyramidal and movement disorders: Secondary | ICD-10-CM

## 2021-05-28 NOTE — Progress Notes (Signed)
    Rebecca Dyer [facility] [address] [place] [address] Phone: [phone]   Assessment and Plan:    1. Right shoulder pain, unspecified chronicity 2. Scapular dyskinesis -Chronic, unchanged, subsequent sports medicine visit - Right-sided scapular dyskinesis appears to be causing patient's neck pain, shoulder pain.  Diagnosis based off of HPI, physical exam - Patient is currently undergoing work-up with a another provider including Toradol injections and MRI of unknown anatomical location.  Patient may follow-up with this provider for additional work-up - Patient educated that the treatment for this condition is physical therapy to help with scapular movements and coordination.  Patient did not start physical therapy after last visit.  Again recommended that she continue HEP and start formal PT for scapular dyskinesis.  Patient declined PT at this time, but states she will contact us back if she wants to do in the future     Pertinent previous records reviewed include none   Follow Up: As needed   Subjective:    I, Rebecca Dyer, am serving as a scribe for Dr. Aleen Dyer.  Chief Complaint: right shoulder pain follow up   HPI:   04/30/21 Patient presents with chronic pain in the right scapula over the past 1 year.  Denies any known mechanism of injury.  States that pain presented slowly and progressively got worse, mostly located in her shoulder blade.  Patient tried physical therapy which was improving her symptoms until she began having left [place] joint pain around 11/2020.  She says that her PT told her her [place] joint was dislocating so they backed off on her physical therapy.  Patient has since stopped physical therapy and is looking for a new PT if we believe it is indicated.  No history of trauma, falls.  No chest pain, shortness of breath, swelling along clavicle or [place] joint.  Denies numbness/tingling/weakness in extremities.  Does not want  to take medication on a regular basis for these conditions.  05/28/21 Patient states that her pain worsened and she had to have 3 injections last week  from [facility]. No new injury but patient woke up in pain. Has been getting massage and is awaiting MRI from another provider.   Relevant Historical Information: None pertinent  Additional pertinent review of systems negative.  No current outpatient medications on file.   Objective:     Vitals:   05/28/21 0919  BP: 112/84  Pulse: 80  SpO2: 97%  Weight: 167 lb (75.8 kg)  Height: 5\' 3"  (1.6 m)      Body mass index is 29.58 kg/m.    Physical Exam:    Gen: Appears well, nad, nontoxic and pleasant Neuro:sensation intact, strength is 5/5 with df/pf/inv/ev, muscle tone wnl Skin: no suspicious lesion or defmority Psych: A&O, appropriate mood and affect   Right and left shoulder: no deformity, swelling or muscle wasting Positive scapular winging on right FF 180, abd 180, int 0, ext 90 TTP rhomboids on right NTTP over  clavicle, ac, coracoid, biceps groove, humerus, deltoid, subacromial space, scap spine, musculature, trap, cervical spine Neg neer, hawkings, empty can, subscap liftoff, speeds, obriens Neg ant drawer, sulcus sign Neg apprehension Negative Spurling's test bilat FROM of neck    Electronically signed by:  D.[facility] 9:42 AM 05/28/21

## 2021-05-28 NOTE — Patient Instructions (Signed)
Let us know if you want physical therapy Please see me as needed

## 2021-06-06 ENCOUNTER — Ambulatory Visit (HOSPITAL_COMMUNITY)
Admission: EM | Admit: 2021-06-06 | Discharge: 2021-06-07 | Disposition: A | Discharge status: Home / Self Care | Payer: 59 | Attending: Family | Admitting: Family

## 2021-06-06 DIAGNOSIS — Z20822 Contact with and (suspected) exposure to covid-19: Secondary | ICD-10-CM | POA: Insufficient documentation

## 2021-06-06 DIAGNOSIS — F909 Attention-deficit hyperactivity disorder, unspecified type: Secondary | ICD-10-CM | POA: Insufficient documentation

## 2021-06-06 DIAGNOSIS — F332 Major depressive disorder, recurrent severe without psychotic features: Secondary | ICD-10-CM | POA: Diagnosis present

## 2021-06-06 DIAGNOSIS — Z791 Long term (current) use of non-steroidal anti-inflammatories (NSAID): Secondary | ICD-10-CM | POA: Diagnosis not present

## 2021-06-06 DIAGNOSIS — F1290 Cannabis use, unspecified, uncomplicated: Secondary | ICD-10-CM | POA: Insufficient documentation

## 2021-06-06 DIAGNOSIS — Z558 Other problems related to education and literacy: Secondary | ICD-10-CM | POA: Insufficient documentation

## 2021-06-06 DIAGNOSIS — Z8249 Family history of ischemic heart disease and other diseases of the circulatory system: Secondary | ICD-10-CM | POA: Insufficient documentation

## 2021-06-06 DIAGNOSIS — Z9152 Personal history of nonsuicidal self-harm: Secondary | ICD-10-CM | POA: Diagnosis not present

## 2021-06-06 LAB — COMPREHENSIVE METABOLIC PANEL
ALT: 15 U/L (ref 0–44)
AST: 12 U/L — ABNORMAL LOW (ref 15–41)
Albumin: 3.7 g/dL (ref 3.5–5.0)
Alkaline Phosphatase: 41 U/L (ref 38–126)
Anion gap: 7 (ref 5–15)
BUN: 10 mg/dL (ref 6–20)
CO2: 25 mmol/L (ref 22–32)
Calcium: 9.2 mg/dL (ref 8.9–10.3)
Chloride: 103 mmol/L (ref 98–111)
Creatinine, Ser: 0.85 mg/dL (ref 0.44–1.00)
GFR, Estimated: >60 mL/min (ref >60)
Glucose, Bld: 126 mg/dL — ABNORMAL HIGH (ref 70–99)
Potassium: 3.7 mmol/L (ref 3.5–5.1)
Sodium: 135 mmol/L (ref 135–145)
Total Bilirubin: 1 mg/dL (ref 0.3–1.2)
Total Protein: 7.1 g/dL (ref 6.5–8.1)

## 2021-06-06 LAB — LIPID PANEL
Cholesterol: 175 mg/dL (ref 0–200)
HDL: 50 mg/dL (ref >40)
LDL Cholesterol: 119 mg/dL — ABNORMAL HIGH (ref 0–99)
Total CHOL/HDL Ratio: 3.5 RATIO
Triglycerides: 28 mg/dL (ref <150)
VLDL: 6 mg/dL (ref 0–40)

## 2021-06-06 LAB — CBC WITH DIFFERENTIAL/PLATELET
Abs Immature Granulocytes: 0.02 10*3/uL (ref 0.00–0.07)
Basophils Absolute: 0.1 10*3/uL (ref 0.0–0.1)
Basophils Relative: 1 %
Eosinophils Absolute: 0.2 10*3/uL (ref 0.0–0.5)
Eosinophils Relative: 2 %
HCT: 38.9 % (ref 36.0–46.0)
Hemoglobin: 13.4 g/dL (ref 12.0–15.0)
Immature Granulocytes: 0 %
Lymphocytes Relative: 34 %
Lymphs Abs: 2.7 10*3/uL (ref 0.7–4.0)
MCH: 33.3 pg (ref 26.0–34.0)
MCHC: 34.4 g/dL (ref 30.0–36.0)
MCV: 96.5 fL (ref 80.0–100.0)
Monocytes Absolute: 0.5 10*3/uL (ref 0.1–1.0)
Monocytes Relative: 6 %
Neutro Abs: 4.5 10*3/uL (ref 1.7–7.7)
Neutrophils Relative %: 57 %
Platelets: 282 10*3/uL (ref 150–400)
RBC: 4.03 MIL/uL (ref 3.87–5.11)
RDW: 11.6 % (ref 11.5–15.5)
WBC: 7.9 10*3/uL (ref 4.0–10.5)
nRBC: 0 % (ref 0.0–0.2)

## 2021-06-06 LAB — HEMOGLOBIN A1C
Hgb A1c MFr Bld: 4.8 % (ref 4.8–5.6)
Mean Plasma Glucose: 91.06 mg/dL

## 2021-06-06 LAB — MAGNESIUM: Magnesium: 2.1 mg/dL (ref 1.7–2.4)

## 2021-06-06 LAB — POCT URINE DRUG SCREEN - MANUAL ENTRY (I-SCREEN)
POC Amphetamine UR: NOT DETECTED
POC Buprenorphine (BUP): NOT DETECTED
POC Cocaine UR: NOT DETECTED
POC Marijuana UR: POSITIVE — AB
POC Methadone UR: NOT DETECTED
POC Methamphetamine UR: NOT DETECTED
POC Morphine: POSITIVE — AB
POC Oxazepam (BZO): NOT DETECTED
POC Oxycodone UR: NOT DETECTED
POC Secobarbital (BAR): NOT DETECTED

## 2021-06-06 LAB — RESP PANEL BY RT-PCR (FLU A&B, COVID) ARPGX2
Influenza A by PCR: NEGATIVE
Influenza B by PCR: NEGATIVE
SARS Coronavirus 2 by RT PCR: NEGATIVE

## 2021-06-06 LAB — TSH: TSH: 1.01 u[IU]/mL (ref 0.350–4.500)

## 2021-06-06 LAB — POC SARS CORONAVIRUS 2 AG -  ED: SARS Coronavirus 2 Ag: NEGATIVE

## 2021-06-06 LAB — POCT PREGNANCY, URINE: Preg Test, Ur: NEGATIVE

## 2021-06-06 LAB — ETHANOL: Alcohol, Ethyl (B): <10 mg/dL (ref <10)

## 2021-06-06 MED ORDER — FLUOXETINE HCL 10 MG PO CAPS
10.0000 mg | ORAL_CAPSULE | Freq: Every day | ORAL | Status: DC
  Administered 2021-06-06 – 2021-06-07 (×2): 10 mg via ORAL
  Filled 2021-06-06 (×2): qty 1

## 2021-06-06 MED ORDER — HYDROXYZINE HCL 25 MG PO TABS: 25.0000 mg | ORAL_TABLET | Freq: Three times a day (TID) | ORAL | Status: DC | PRN

## 2021-06-06 MED ORDER — ACETAMINOPHEN 325 MG PO TABS: 650.0000 mg | ORAL_TABLET | Freq: Four times a day (QID) | ORAL | Status: DC | PRN

## 2021-06-06 MED ORDER — MAGNESIUM HYDROXIDE 400 MG/5ML PO SUSP: 30.0000 mL | Freq: Every day | ORAL | Status: DC | PRN

## 2021-06-06 MED ORDER — TRAZODONE HCL 50 MG PO TABS: 50.0000 mg | ORAL_TABLET | Freq: Every evening | ORAL | Status: DC | PRN

## 2021-06-06 MED ORDER — ALUM & MAG HYDROXIDE-SIMETH 200-200-20 MG/5ML PO SUSP: 30.0000 mL | ORAL | Status: DC | PRN

## 2021-06-06 NOTE — ED Notes (Signed)
Pt sleeping in [facility] observation unit. Respirations even and unlabored with no signs of acute distress. Will continue to monitor for safety.  

## 2021-06-06 NOTE — ED Notes (Signed)
30 yo female admitted to continuous assessment endorsing passive SI due to recent argument with partner and school stressors. Denies plan or intent to harm self. Denies HI/AVH. Cooperative throughout assessment. Oriented to unit and unit rules. Will monitor for safety.

## 2021-06-06 NOTE — Progress Notes (Signed)
   06/06/21 1630  [facility] Triage Screening (Walk-ins at [facility] only)  How Did You Hear About [place]? Family/Friend  What Is the Reason for Your Visit/Call Today? Patient presents with wife due to worsening depression for the past few months.  Patient reports the primary stressor is feeling distant with wife, as wife is spending more time with her friends than her.  Patient's wife is present and states they both deal with depression.  Patient's wife went out with friends on Saturday and drank too much, needing to wait to drive.  Per wife, "she freaked out," screaming and yelling, making concerning statements.  Today, patient's wife came home to find patient took a vicodin, stating she didn't want to "feel anymore." Patient appears depressed, initially refused to come with wife.  She eventually agreed to come for assessment.  Patient states her wife's report is accurate.  She shares she checked wife's phone and saw her wife had screen shots of their discussions that she sent to her friend.  Patient expresses concern that wife's friend may be interested in her. Patient has had passive SI, however she denies having a plan.  She admits to hx of cutting, most recent episode 3 yrs ago.  Patient struggles to affirm safety at this point.  How Long Has This Been Causing You Problems? 1-6 months  Have You Recently Had Any Thoughts About Hurting Yourself? Yes  How long ago did you have thoughts about hurting yourself? Today - passive SI - took one vicodin, feels "better"  Are You Planning to Commit Suicide/Harm Yourself At This time? No  Have you Recently Had Thoughts About Hurting Someone [name]? No  Are You Planning To Harm Someone At This Time? No  Are you currently experiencing any auditory, visual or other hallucinations? No  Have You Used Any Alcohol or Drugs in the Past 24 Hours? Yes  How long ago did you use Drugs or Alcohol? few sips of beer today after taking a vicodin - smokes THC every other day  What Did You Use  and How Much? see above  Do you have any current medical co-morbidities that require immediate attention? No  Clinician description of patient physical appearance/behavior: Patient presents with flat affect, calm and cooperative  What Do You Feel Would Help You the Most Today? Treatment for Depression or other mood problem  If access to [facility] Urgent [facility] was not available, would you have sought care in the Emergency Department? No  Determination of Need Urgent (48 hours)  Options For Referral Medication Management;Outpatient Therapy;Intensive Outpatient Therapy;BH Urgent Care;Inpatient Hospitalization
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
## 2021-06-06 NOTE — ED Notes (Signed)
[facility] lab courier called to transport labs to [facility] lab 
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
## 2021-06-06 NOTE — ED Notes (Signed)
Pt laying in bed in [facility] observation. Calm and cooperative during shift assessment conversation. Pt states no SI, HI, or AVH. A&O x4 with no signs of acute distress. Will continue to monitor patient.

## 2021-06-07 DIAGNOSIS — F332 Major depressive disorder, recurrent severe without psychotic features: Secondary | ICD-10-CM | POA: Diagnosis not present

## 2021-06-07 LAB — PROLACTIN: Prolactin: 13.4 ng/mL (ref 4.8–23.3)

## 2021-06-07 MED ORDER — HYDROXYZINE HCL 25 MG PO TABS: 25.0000 mg | ORAL_TABLET | Freq: Three times a day (TID) | ORAL | 0 refills | Status: DC | PRN

## 2021-06-07 MED ORDER — FLUOXETINE HCL 10 MG PO CAPS: 10.0000 mg | ORAL_CAPSULE | Freq: Every day | ORAL | 0 refills | Status: DC

## 2021-06-07 NOTE — ED Notes (Signed)
Pt sleeping in adult observation [facility] unit. Respirations even and unlabored with no signs of acute distress. Will continue to monitor for safety.  
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
## 2021-06-07 NOTE — Discharge Instructions (Addendum)

## 2021-06-07 NOTE — ED Notes (Signed)
Patient was discharged from continuous assessment today. Patient received her discharge instructions which included resources. Patient denied SI/HI prior to discharge.

## 2021-06-07 NOTE — ED Notes (Signed)
Patient is discharged off the unit at this time

## 2021-06-07 NOTE — ED Notes (Signed)
Patient has been calm and cooperative this morning. Patient has denied SI/HI. Patient took her morning medications as prescribed.

## 2021-06-08 ENCOUNTER — Telehealth (HOSPITAL_COMMUNITY): Payer: Self-pay | Admitting: Internal Medicine

## 2021-06-08 NOTE — BH Assessment (Signed)
Care Management - Follow Up BHUC Discharges   Writer attempted to make contact with patient today and was unsuccessful.  Patient voicemail is full.   Per chart review,  patient will follow-up with outpatient psychiatry resources that were provided at discharge.   

## 2021-11-30 IMAGING — DX DG SHOULDER 2+V*R*
3 series · 3 of 3 positions shown · non-contrast
Comparison: None.

CLINICAL DATA: Right shoulder pain, no known injury, initial
encounter

EXAM:
RIGHT SHOULDER - 2+ VIEW

[shoulder ap (1 of 2)]
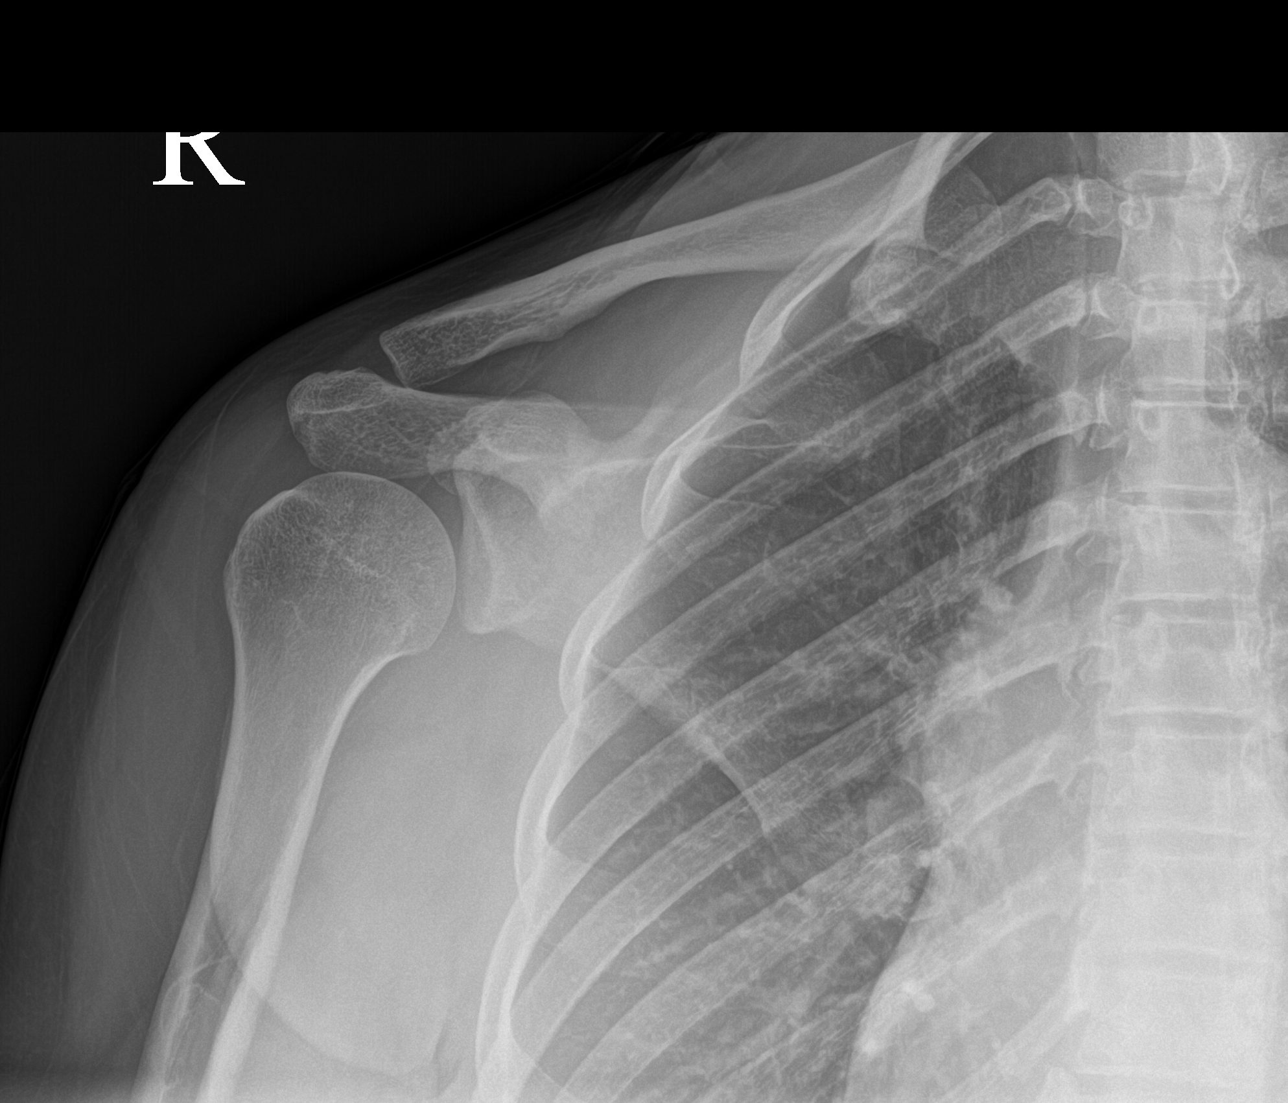

[shoulder ap (2 of 2)]
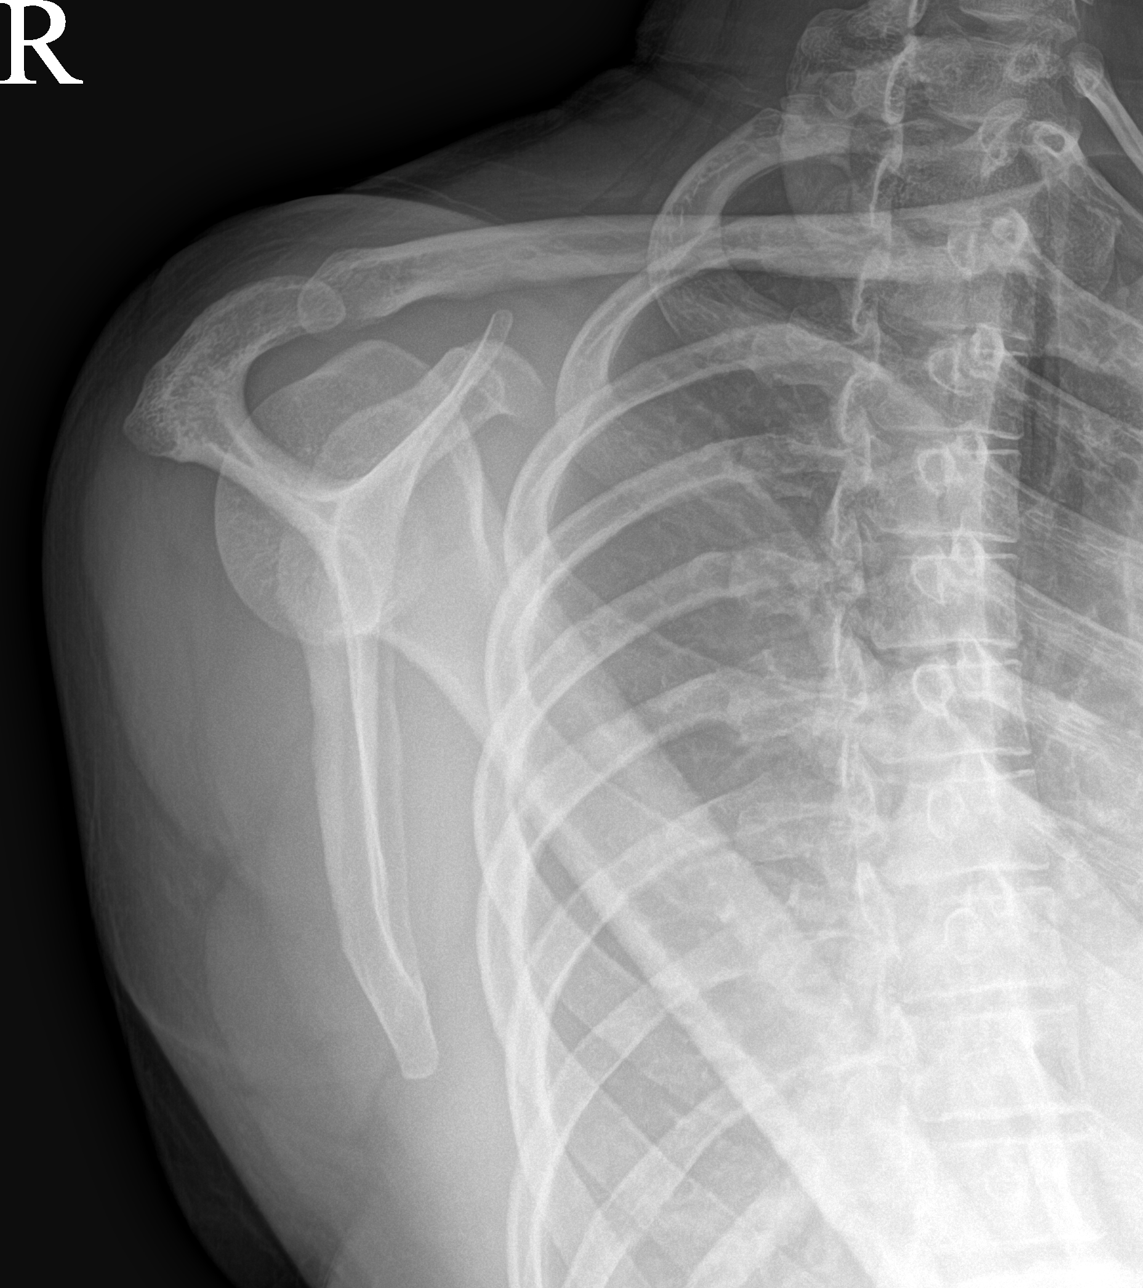

[shoulder axial]
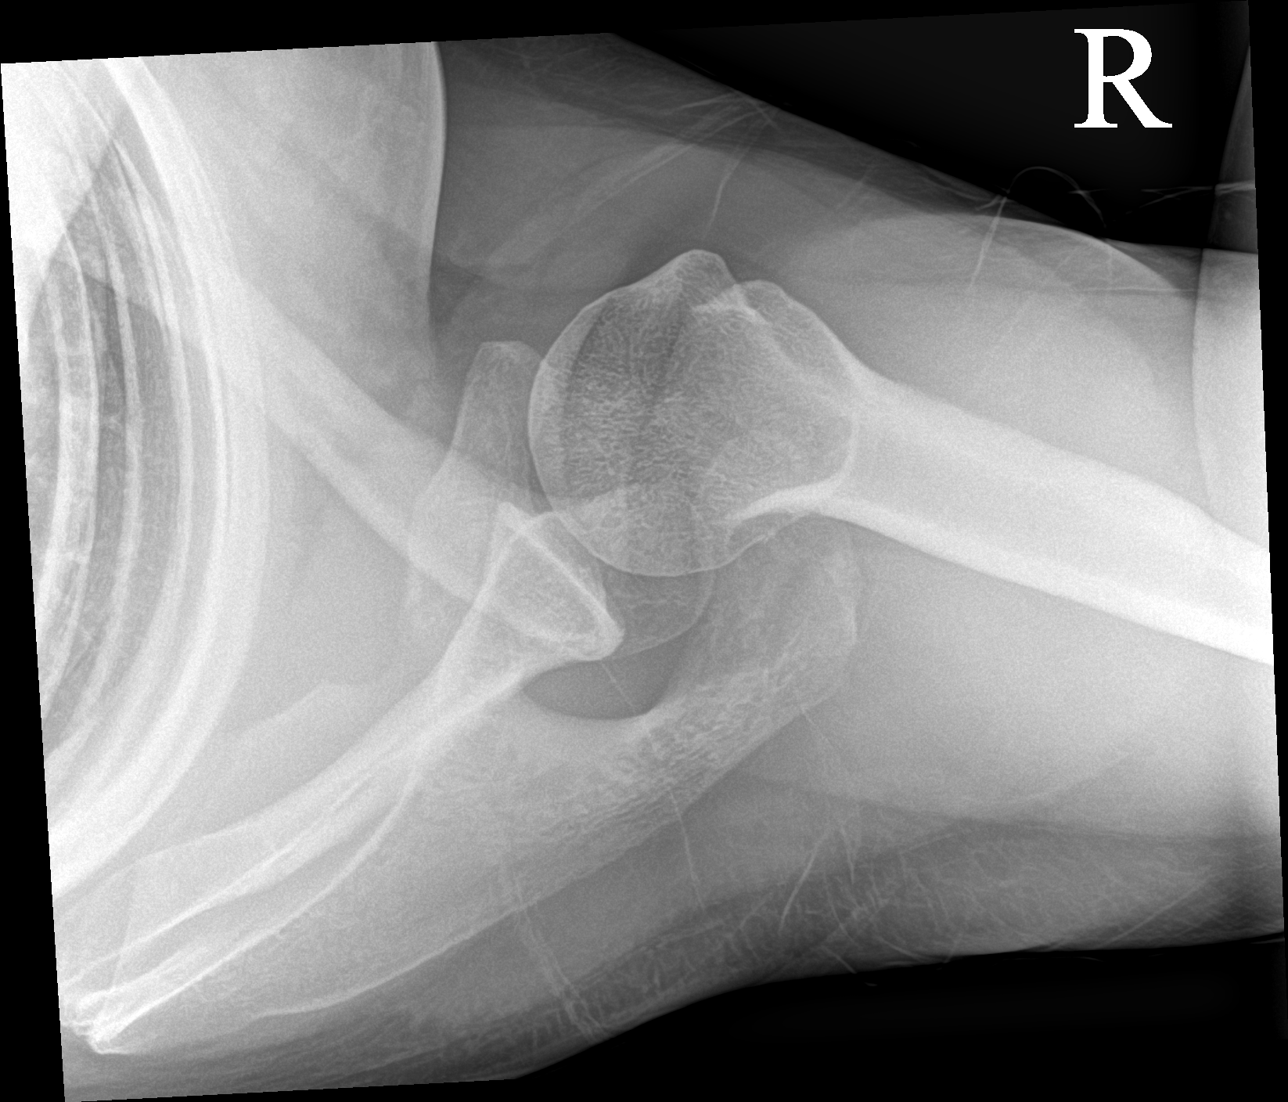

[3 of 3 positions shown; findings below may reference images not displayed]

FINDINGS: There is no evidence of fracture or dislocation. There is no
evidence of arthropathy or other focal bone abnormality. Soft
tissues are unremarkable.
IMPRESSION: No acute abnormality noted.

## 2022-08-21 ENCOUNTER — Ambulatory Visit (INDEPENDENT_AMBULATORY_CARE_PROVIDER_SITE_OTHER): Payer: Commercial Managed Care - PPO | Admitting: Family Medicine

## 2022-08-21 ENCOUNTER — Encounter: Payer: Self-pay | Admitting: Family Medicine

## 2022-08-21 VITALS — BP 114/78 | HR 75 | Temp 98.7°F | Wt 159.0 lb

## 2022-08-21 DIAGNOSIS — J020 Streptococcal pharyngitis: Secondary | ICD-10-CM | POA: Diagnosis not present

## 2022-08-21 LAB — POCT RAPID STREP A (OFFICE): Rapid Strep A Screen: POSITIVE — AB

## 2022-08-21 MED ORDER — AMOXICILLIN-POT CLAVULANATE 600-42.9 MG/5ML PO SUSR: 600.0000 mg | Freq: Two times a day (BID) | ORAL | 0 refills | Status: AC

## 2022-08-21 MED ORDER — LIDOCAINE VISCOUS HCL 2 % MT SOLN: 5.0000 mL | Freq: Three times a day (TID) | OROMUCOSAL | 0 refills | Status: DC | PRN

## 2022-08-21 NOTE — Patient Instructions (Signed)

## 2022-08-21 NOTE — Progress Notes (Signed)
Assessment/Plan:   Problem List Items Addressed This Visit   None Visit Diagnoses     Strep throat    -  Primary   Relevant Medications   amoxicillin-clavulanate (AUGMENTIN) 600-42.9 MG/5ML suspension   magic mouthwash (lidocaine, diphenhydrAMINE, alum & mag hydroxide) suspension   Other Relevant Orders   POCT rapid strep A (Completed)       There are no discontinued medications.    Subjective:  HPI: Encounter date: 08/21/2022  Rebecca Dyer is a 32 y.o. female who has ADHD (attention deficit hyperactivity disorder); Internal hemorrhoids; History of depression; Family history of diabetes mellitus; and History of pyloric stenosis as a child on their problem list..   She  has a past medical history of ADHD (attention deficit hyperactivity disorder), Depression, GERD (gastroesophageal reflux disease), and IBS (irritable bowel syndrome)..    CHIEF COMPLAINT: Sore throat and difficulty swallowing primarily affecting the tonsils.  HISTORY OF PRESENT ILLNESS:  Problem 1: The patient reports a severe sore throat since Monday, particularly affecting the tonsils, causing difficulty with eating and swallowing. The symptoms have prompted the patient to seek medical attention. The patient also mentions forcing herself to eat due to feeling hungry, although it is challenging. The patient indicates a history of recurrent strep throat during childhood and confirms a previous reaction to mosquito bites that required penicillin shots. There is a concern for managing pain and discomfort associated with current symptoms.  REVIEW OF SYSTEMS: ENT: Reports sore throat and difficulty swallowing. All other systems review of systems was reportedly negative based on the discussion.  Past Surgical History:  Procedure Laterality Date   pyloric stenosis  2831   UMBILICAL HERNIA REPAIR  1998   as a child   WISDOM TOOTH EXTRACTION Bilateral 2010    Outpatient Medications Prior to Visit  Medication  Sig Dispense Refill   FLUoxetine (PROZAC) 10 MG capsule Take 1 capsule (10 mg total) by mouth daily. (Patient not taking: Reported on 08/21/2022) 60 capsule 0   hydrOXYzine (ATARAX/VISTARIL) 25 MG tablet Take 1 tablet (25 mg total) by mouth 3 (three) times daily as needed for anxiety. (Patient not taking: Reported on 08/21/2022) 30 tablet 0   ibuprofen (ADVIL) 100 MG/5ML suspension Take 300 mg by mouth every 6 (six) hours as needed (For back pain). (Patient not taking: Reported on 08/21/2022)     OVER THE COUNTER MEDICATION Take 10 mg by mouth at bedtime as needed (For sleep). Melatonin Fast Dissolve 10mg  Tablet (Patient not taking: Reported on 08/21/2022)     No facility-administered medications prior to visit.    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   Tobacco Use   Smoking status: Former   Smokeless tobacco: Never  Scientific laboratory technician Use: Never used  Substance and Sexual Activity   Alcohol use: Yes    Comment: socially   Drug use: Yes  Types: Marijuana   Sexual activity: Not Currently    Birth control/protection: Condom    Comment: 1st intercourse 32 yo-More than 5 partners  Other Topics Concern   Not on file  Social History Narrative   Sexual preference: females    Did one year of college   Household:   At home is the patient & her girlfriend     Social Determinants of Radio broadcast assistant Strain: Not on file  Food Insecurity: Not on file  Transportation Needs: Not on file  Physical Activity: Not on file  Stress: Not on file   Social Connections: Not on file  Intimate Partner Violence: Not on file                                                                                                 Objective:  Physical Exam: BP 114/78 (BP Location: Left Arm, Patient Position: Sitting, Cuff Size: Large)   Pulse 75   Temp 98.7 F (37.1 C) (Oral)   Wt 159 lb (72.1 kg)   SpO2 98%   BMI 28.17 kg/m    General: No acute distress. Awake and conversant.  Eyes: Normal conjunctiva, anicteric. Round symmetric pupils.  ENT: Hearing grossly intact. No nasal discharge.  ENLARGED TONSILS Neck: Neck is supple. No masses or thyromegaly.  Respiratory: Respirations are non-labored. No auditory wheezing.  Skin: Warm. No rashes or ulcers.  Psych: Alert and oriented. Cooperative, Appropriate mood and affect, Normal judgment.  CV: No cyanosis or JVD MSK: Normal ambulation. No clubbing  Neuro: Sensation and CN II-XII grossly normal.   Results for orders placed or performed in visit on 08/21/22  POCT rapid strep A  Result Value Ref Range   Rapid Strep A Screen Positive (A) Negative         Alesia Banda, MD, MS

## 2022-08-22 ENCOUNTER — Telehealth: Payer: Self-pay

## 2022-08-22 NOTE — Telephone Encounter (Signed)
Tried Health and safety inspector for medication status, but was on hold for 10 minutes. Will try again   Left patient a detailed voice message regarding after hour call about her not receiving medication from her pharmacy. I advised patient to give [place] a call back if she didn't get medication.

## 2022-10-02 ENCOUNTER — Ambulatory Visit: Payer: Commercial Managed Care - PPO | Admitting: Physician Assistant

## 2022-10-02 ENCOUNTER — Ambulatory Visit (INDEPENDENT_AMBULATORY_CARE_PROVIDER_SITE_OTHER): Payer: Commercial Managed Care - PPO | Admitting: Sports Medicine

## 2022-10-02 ENCOUNTER — Ambulatory Visit (INDEPENDENT_AMBULATORY_CARE_PROVIDER_SITE_OTHER): Payer: Commercial Managed Care - PPO

## 2022-10-02 VITALS — BP 132/80 | HR 68 | Ht 63.0 in | Wt 159.0 lb

## 2022-10-02 DIAGNOSIS — M25531 Pain in right wrist: Secondary | ICD-10-CM

## 2022-10-02 NOTE — Patient Instructions (Addendum)
Good to see you Tylenol [phone] mg 2-3 times a day for pain relief  1 week follow up
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
## 2022-10-09 ENCOUNTER — Ambulatory Visit (INDEPENDENT_AMBULATORY_CARE_PROVIDER_SITE_OTHER): Payer: Commercial Managed Care - PPO | Admitting: Sports Medicine

## 2022-10-09 ENCOUNTER — Other Ambulatory Visit: Payer: Self-pay | Admitting: Sports Medicine

## 2022-10-09 ENCOUNTER — Ambulatory Visit (INDEPENDENT_AMBULATORY_CARE_PROVIDER_SITE_OTHER): Payer: Commercial Managed Care - PPO

## 2022-10-09 VITALS — BP 118/80 | HR 52 | Ht 63.0 in | Wt 159.0 lb

## 2022-10-09 DIAGNOSIS — M25531 Pain in right wrist: Secondary | ICD-10-CM | POA: Diagnosis not present

## 2022-10-09 MED ORDER — MELOXICAM 7.5 MG/5ML PO SUSP: 15.0000 mg | Freq: Every day | ORAL | 0 refills | Status: DC

## 2022-10-09 MED ORDER — MELOXICAM 15 MG PO TABS: 15.0000 mg | ORAL_TABLET | Freq: Every day | ORAL | 0 refills | Status: AC

## 2022-10-09 NOTE — Addendum Note (Signed)
Addended by: Glennon Mac on: 10/09/2022 03:20 PM   Modules accepted: Orders

## 2022-10-09 NOTE — Patient Instructions (Addendum)
Good to see you  - Start meloxicam 15 mg daily x2 weeks.  If still having pain after 2 weeks, complete 3rd-week of meloxicam. May use remaining meloxicam as needed once daily for pain control.  Do not to use additional NSAIDs while taking meloxicam.  May use Tylenol [phone] mg 2 to 3 times a day for breakthrough pain. Wrist brace an additional 1 week then discontinue brace use  Elbow and wrist HEP  3 week follow up
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
## 2022-10-30 ENCOUNTER — Ambulatory Visit (INDEPENDENT_AMBULATORY_CARE_PROVIDER_SITE_OTHER): Payer: Commercial Managed Care - PPO | Admitting: Sports Medicine

## 2022-10-30 VITALS — HR 61 | Ht 63.0 in | Wt 159.0 lb

## 2022-10-30 DIAGNOSIS — K469 Unspecified abdominal hernia without obstruction or gangrene: Secondary | ICD-10-CM
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
## 2022-10-30 NOTE — Patient Instructions (Addendum)
Good to see you  General surgery referral  As needed follow up

## 2022-11-22 ENCOUNTER — Ambulatory Visit: Payer: Self-pay | Admitting: Surgery

## 2022-11-22 NOTE — H&P (Signed)
Subjective    Chief Complaint: New Consultation and Hernia       History of Present Illness: Rebecca Dyer is a 32 y.o. female who is seen today as an office consultation at the request of Dr. Jean Rosenthal for evaluation of New Consultation and Hernia .   This is a 32 year old female in good health who has had a previous umbilical hernia repair as a child.  She also had pyloric stenosis surgery as a baby.  She presents with several years of a small palpable hernia above her umbilicus.  This has become larger and is more uncomfortable.  This area is no longer reducible.  She denies any GI obstructive symptoms.  No recent imaging.     Review of Systems: A complete review of systems was obtained from the patient.  I have reviewed this information and discussed as appropriate with the patient.  See HPI as well for other ROS.   Review of Systems  Constitutional: Negative.   HENT: Negative.    Eyes: Negative.   Respiratory: Negative.    Cardiovascular: Negative.   Gastrointestinal:  Positive for abdominal pain.  Genitourinary: Negative.   Musculoskeletal: Negative.   Skin: Negative.   Neurological: Negative.   Endo/Heme/Allergies: Negative.   Psychiatric/Behavioral: Negative.          Medical History: Past Medical History      Past Medical History:  Diagnosis Date   Anemia     Anxiety     Thyroid disease             Patient Active Problem List  Diagnosis   ADHD (attention deficit hyperactivity disorder)   Family history of diabetes mellitus   History of depression   History of pyloric stenosis as a child      Past Surgical History       Past Surgical History:  Procedure Laterality Date   HERNIA REPAIR       pyloric stenosis            Allergies       Allergies  Allergen Reactions   Cefadroxil Hives   Latex Other (See Comments), Rash and Swelling      Burning sensation        No current outpatient medications on file prior to visit.    No current  facility-administered medications on file prior to visit.      Family History  History reviewed. No pertinent family history.      Social History        Tobacco Use  Smoking Status Former   Types: Cigarettes   Quit date: 2013   Years since quitting: 11.2  Smokeless Tobacco Never      Social History  Social History         Socioeconomic History   Marital status: Married  Tobacco Use   Smoking status: Former      Types: Cigarettes      Quit date: 2013      Years since quitting: 11.2   Smokeless tobacco: Never  Substance and Sexual Activity   Alcohol use: Not Currently      Comment: once a day   Drug use: Never        Objective:         Vitals:    11/22/22 1001  BP: 111/69  Pulse: 74  Temp: 36.7 C (98 F)  SpO2: 98%  Weight: 72.6 kg (160 lb)  Height: 160 cm ( )  PainSc:   8  Body mass index is 28.34 kg/m.   Physical Exam    Constitutional:  WDWN in NAD, conversant, no obvious deformities; lying in bed comfortably Eyes:  Pupils equal, round; sclera anicteric; moist conjunctiva; no lid lag HENT:  Oral mucosa moist; good dentition  Neck:  No masses palpated, trachea midline; no thyromegaly Lungs:  CTA bilaterally; normal respiratory effort CV:  Regular rate and rhythm; no murmurs; extremities well-perfused with no edema Abd:  +bowel sounds, soft, non-tender, no palpable organomegaly; healed transverse incision from her pyloric surgery with some contracture inferior to this scar.  She has a 1.5 cm infraumbilical incision with no sign of umbilical hernia.  Between these 2 incisions, there is a palpable 1.5 cm subcutaneous hernia.  This is partially reducible.  My best estimate of the hernia defect on external examination is that it is less than a centimeter. Musc: Normal gait; no apparent clubbing or cyanosis in extremities Lymphatic:  No palpable cervical or axillary lymphadenopathy Skin:  Warm, dry; no sign of jaundice Psychiatric - alert and  oriented x 4; calm mood and affect     Assessment and Plan:  Diagnoses and all orders for this visit:   Ventral hernia without obstruction or gangrene Comments: 1 cm - supraumbilical       Recommend ventral hernia repair with mesh.The surgical procedure has been discussed with the patient.  Potential risks, benefits, alternative treatments, and expected outcomes have been explained.  All of the patient's questions at this time have been answered.  The likelihood of reaching the patient's treatment goal is good.  The patient understand the proposed surgical procedure and wishes to proceed.       Rabab Currington Delbert Harness, MD  11/22/2022 10:20 AM

## 2023-05-30 ENCOUNTER — Other Ambulatory Visit (HOSPITAL_BASED_OUTPATIENT_CLINIC_OR_DEPARTMENT_OTHER): Payer: Self-pay

## 2023-05-30 ENCOUNTER — Encounter (HOSPITAL_BASED_OUTPATIENT_CLINIC_OR_DEPARTMENT_OTHER): Payer: Self-pay | Admitting: Student

## 2023-05-30 ENCOUNTER — Ambulatory Visit (HOSPITAL_BASED_OUTPATIENT_CLINIC_OR_DEPARTMENT_OTHER): Payer: Commercial Managed Care - PPO

## 2023-05-30 ENCOUNTER — Ambulatory Visit (HOSPITAL_BASED_OUTPATIENT_CLINIC_OR_DEPARTMENT_OTHER): Payer: Commercial Managed Care - PPO | Admitting: Student

## 2023-05-30 DIAGNOSIS — M5412 Radiculopathy, cervical region: Secondary | ICD-10-CM | POA: Diagnosis not present

## 2023-05-30 DIAGNOSIS — G8929 Other chronic pain: Secondary | ICD-10-CM | POA: Diagnosis not present

## 2023-05-30 DIAGNOSIS — M542 Cervicalgia: Secondary | ICD-10-CM | POA: Diagnosis not present

## 2023-05-30 DIAGNOSIS — M25512 Pain in left shoulder: Secondary | ICD-10-CM | POA: Diagnosis not present

## 2023-05-30 MED ORDER — METHYLPREDNISOLONE 4 MG PO TBPK
ORAL_TABLET | ORAL | 0 refills | Status: AC
  Filled 2023-05-30: qty 21, 6d supply, fill #0

## 2023-05-30 MED ORDER — TRAMADOL HCL 50 MG PO TABS
50.0000 mg | ORAL_TABLET | Freq: Two times a day (BID) | ORAL | 0 refills | Status: AC | PRN
  Filled 2023-05-30: qty 6, 3d supply, fill #0
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
## 2023-06-17 ENCOUNTER — Encounter: Payer: Self-pay | Admitting: *Deleted

## 2023-08-18 ENCOUNTER — Telehealth: Payer: Self-pay | Admitting: Internal Medicine

## 2023-08-18 NOTE — Telephone Encounter (Signed)
 Patient is calling to see if she can be seen sooner she  has some stomach issue and incontinence  issues as well

## 2023-08-19 NOTE — Telephone Encounter (Signed)
 Message left for patient today.  If she calls back if she does not want to wait and see Dr. Lawerance Bach she can see another provider who has an opening this week.   Please schedule accordingly.

## 2023-09-02 NOTE — Progress Notes (Deleted)
    Subjective:    Patient ID: Rebecca Dyer, female    DOB: July 14, 1991, 33 y.o.   MRN: [id number]      HPI Kirstien is here for No chief complaint on file.   Digestive issues -    Gyn issues      Medications and allergies reviewed with patient and updated if appropriate.  Current Outpatient Medications on File Prior to Visit  Medication Sig Dispense Refill   FLUoxetine (PROZAC) 10 MG capsule Take 1 capsule (10 mg total) by mouth daily. 60 capsule 0   hydrOXYzine (ATARAX/VISTARIL) 25 MG tablet Take 1 tablet (25 mg total) by mouth 3 (three) times daily as needed for anxiety. 30 tablet 0   ibuprofen (ADVIL) 100 MG/5ML suspension Take 300 mg by mouth every 6 (six) hours as needed (For back pain).     magic mouthwash (lidocaine, diphenhydrAMINE, alum & mag hydroxide) suspension Swish and spit 5 mLs 3 (three) times daily as needed for mouth pain. 360 mL 0   OVER THE COUNTER MEDICATION Take 10 mg by mouth at bedtime as needed (For sleep). Melatonin Fast Dissolve 10mg  Tablet     No current facility-administered medications on file prior to visit.    Review of Systems     Objective:  There were no vitals filed for this visit. BP Readings from Last 3 Encounters:  10/09/22 118/80  10/02/22 132/80  08/21/22 114/78   Wt Readings from Last 3 Encounters:  10/30/22 159 lb (72.1 kg)  10/09/22 159 lb (72.1 kg)  10/02/22 159 lb (72.1 kg)   There is no height or weight on file to calculate BMI.    Physical Exam         Assessment & Plan:    See Problem List for Assessment and Plan of chronic medical problems.

## 2023-09-03 ENCOUNTER — Ambulatory Visit: Payer: Commercial Managed Care - PPO | Admitting: Internal Medicine

## 2023-09-07 NOTE — Progress Notes (Unsigned)
    Subjective:    Patient ID: Rebecca Dyer, female    DOB: [birth year], 33 y.o.   MRN: [id number]      HPI Rebecca Dyer is here for No chief complaint on file.   Digestive issues -    Gyn issues      Medications and allergies reviewed with patient and updated if appropriate.  Current Outpatient Medications on File Prior to Visit  Medication Sig Dispense Refill   FLUoxetine (PROZAC) 10 MG capsule Take 1 capsule (10 mg total) by mouth daily. 60 capsule 0   hydrOXYzine (ATARAX/VISTARIL) 25 MG tablet Take 1 tablet (25 mg total) by mouth 3 (three) times daily as needed for anxiety. 30 tablet 0   ibuprofen (ADVIL) 100 MG/5ML suspension Take 300 mg by mouth every 6 (six) hours as needed (For back pain).     magic mouthwash (lidocaine, diphenhydrAMINE, alum & mag hydroxide) suspension Swish and spit 5 mLs 3 (three) times daily as needed for mouth pain. 360 mL 0   OVER THE COUNTER MEDICATION Take 10 mg by mouth at bedtime as needed (For sleep). Melatonin Fast Dissolve 10mg  Tablet     No current facility-administered medications on file prior to visit.    Review of Systems     Objective:  There were no vitals filed for this visit. BP Readings from Last 3 Encounters:  10/09/22 118/80  10/02/22 132/80  08/21/22 114/78   Wt Readings from Last 3 Encounters:  10/30/22 159 lb (72.1 kg)  10/09/22 159 lb (72.1 kg)  10/02/22 159 lb (72.1 kg)   There is no height or weight on file to calculate BMI.    Physical Exam         Assessment & Plan:    See Problem List for Assessment and Plan of chronic medical problems.

## 2023-09-08 ENCOUNTER — Encounter: Payer: Self-pay | Admitting: Internal Medicine

## 2023-09-08 ENCOUNTER — Ambulatory Visit (INDEPENDENT_AMBULATORY_CARE_PROVIDER_SITE_OTHER): Payer: Medicaid Other | Admitting: Internal Medicine

## 2023-09-08 VITALS — BP 116/72 | HR 78 | Temp 98.6°F | Ht 63.0 in | Wt 175.0 lb

## 2023-09-08 DIAGNOSIS — Z124 Encounter for screening for malignant neoplasm of cervix: Secondary | ICD-10-CM

## 2023-09-08 DIAGNOSIS — R55 Syncope and collapse: Secondary | ICD-10-CM | POA: Insufficient documentation

## 2023-09-08 DIAGNOSIS — R739 Hyperglycemia, unspecified: Secondary | ICD-10-CM | POA: Insufficient documentation

## 2023-09-08 DIAGNOSIS — R32 Unspecified urinary incontinence: Secondary | ICD-10-CM | POA: Diagnosis not present

## 2023-09-08 DIAGNOSIS — R635 Abnormal weight gain: Secondary | ICD-10-CM | POA: Insufficient documentation

## 2023-09-08 LAB — CBC WITH DIFFERENTIAL/PLATELET
Basophils Absolute: 0.1 10*3/uL (ref 0.0–0.1)
Basophils Relative: 1 % (ref 0.0–3.0)
Eosinophils Absolute: 0.1 10*3/uL (ref 0.0–0.7)
Eosinophils Relative: 2.8 % (ref 0.0–5.0)
HCT: 36.9 % (ref 36.0–46.0)
Hemoglobin: 12.5 g/dL (ref 12.0–15.0)
Lymphocytes Relative: 37.1 % (ref 12.0–46.0)
Lymphs Abs: 1.9 10*3/uL (ref 0.7–4.0)
MCHC: 33.9 g/dL (ref 30.0–36.0)
MCV: 96.5 fL (ref 78.0–100.0)
Monocytes Absolute: 0.4 10*3/uL (ref 0.1–1.0)
Monocytes Relative: 8.2 % (ref 3.0–12.0)
Neutro Abs: 2.6 10*3/uL (ref 1.4–7.7)
Neutrophils Relative %: 50.9 % (ref 43.0–77.0)
Platelets: 273 10*3/uL (ref 150.0–400.0)
RBC: 3.83 Mil/uL — ABNORMAL LOW (ref 3.87–5.11)
RDW: 13.5 % (ref 11.5–15.5)
WBC: 5.2 10*3/uL (ref 4.0–10.5)

## 2023-09-08 LAB — COMPREHENSIVE METABOLIC PANEL
ALT: 11 U/L (ref 0–35)
AST: 13 U/L (ref 0–37)
Albumin: 4.2 g/dL (ref 3.5–5.2)
Alkaline Phosphatase: 38 U/L — ABNORMAL LOW (ref 39–117)
BUN: 10 mg/dL (ref 6–23)
CO2: 25 meq/L (ref 19–32)
Calcium: 9.1 mg/dL (ref 8.4–10.5)
Chloride: 107 meq/L (ref 96–112)
Creatinine, Ser: 0.83 mg/dL (ref 0.40–1.20)
GFR: 94.6 mL/min (ref >60.00)
Glucose, Bld: 87 mg/dL (ref 70–99)
Potassium: 4.4 meq/L (ref 3.5–5.1)
Sodium: 137 meq/L (ref 135–145)
Total Bilirubin: 0.5 mg/dL (ref 0.2–1.2)
Total Protein: 7.2 g/dL (ref 6.0–8.3)

## 2023-09-08 LAB — TSH: TSH: 1.46 u[IU]/mL (ref 0.35–5.50)

## 2023-09-08 LAB — HEMOGLOBIN A1C: Hgb A1c MFr Bld: 5.2 % (ref 4.6–6.5)

## 2023-09-08 NOTE — Assessment & Plan Note (Signed)
Has had elevated sugar in the past Has family history of diabetes I doubt that the urine incontinence is related to diabetes Will check A1c

## 2023-09-08 NOTE — Assessment & Plan Note (Signed)
New Had 2-3 episodes about 2 weeks ago of urine incontinence-she did not know the urine was coming-just came out and she had no control This has resolved at this point No other concerning symptoms of dysuria, hematuria, frequency Since the symptoms have resolved will not evaluate unless they recur

## 2023-09-08 NOTE — Assessment & Plan Note (Signed)
New Has had what sounds like near syncope 2-3 times over the past year She states the symptoms include tightness in her chest, lightheadedness, headache and palpitations She is not able to tell me any more about the situations of when these occurred Discussed orthostatic hypotension and hypoglycemia as common causes Discussed that it could be a cardiac cause as well Will check A1c, CBC, CMP Discussed possible EKG and Holter monitor-she wanted to hold off on that for now and if the symptoms continue she will let me know She will try to get a better pattern of if and when the symptoms are occurring and follow-up if they persist

## 2023-09-08 NOTE — Assessment & Plan Note (Signed)
States waking She states she has increased her protein intake.  She is exercising less, but is still active Discussed that most likely she is consuming too many calories that is why the weight has gone up If she is increasing her protein she may need to decrease her other calories or increase her cardio Will check TSH

## 2023-09-08 NOTE — Patient Instructions (Addendum)
      Blood work was ordered.       Medications changes include :   None    A referral was ordered for Gynecology and someone will call you to schedule an appointment.     Return if symptoms worsen or fail to improve.

## 2023-11-11 ENCOUNTER — Emergency Department (HOSPITAL_COMMUNITY)
Admission: EM | Admit: 2023-11-11 | Discharge: 2023-11-11 | Disposition: A | Discharge status: Home / Self Care | Attending: Emergency Medicine | Admitting: Emergency Medicine

## 2023-11-11 ENCOUNTER — Other Ambulatory Visit: Payer: Self-pay

## 2023-11-11 ENCOUNTER — Encounter (HOSPITAL_COMMUNITY): Payer: Self-pay

## 2023-11-11 ENCOUNTER — Emergency Department (HOSPITAL_COMMUNITY)

## 2023-11-11 DIAGNOSIS — R1032 Left lower quadrant pain: Secondary | ICD-10-CM | POA: Insufficient documentation

## 2023-11-11 DIAGNOSIS — Z91040 Latex allergy status: Secondary | ICD-10-CM | POA: Diagnosis not present

## 2023-11-11 DIAGNOSIS — R1030 Lower abdominal pain, unspecified: Secondary | ICD-10-CM

## 2023-11-11 DIAGNOSIS — K769 Liver disease, unspecified: Secondary | ICD-10-CM

## 2023-11-11 DIAGNOSIS — D259 Leiomyoma of uterus, unspecified: Secondary | ICD-10-CM

## 2023-11-11 LAB — CBC WITH DIFFERENTIAL/PLATELET
Abs Immature Granulocytes: 0.04 10*3/uL (ref 0.00–0.07)
Basophils Absolute: 0 10*3/uL (ref 0.0–0.1)
Basophils Relative: 0 %
Eosinophils Absolute: 0 10*3/uL (ref 0.0–0.5)
Eosinophils Relative: 0 %
HCT: 37.5 % (ref 36.0–46.0)
Hemoglobin: 12.8 g/dL (ref 12.0–15.0)
Immature Granulocytes: 0 %
Lymphocytes Relative: 17 %
Lymphs Abs: 1.6 10*3/uL (ref 0.7–4.0)
MCH: 32.2 pg (ref 26.0–34.0)
MCHC: 34.1 g/dL (ref 30.0–36.0)
MCV: 94.2 fL (ref 80.0–100.0)
Monocytes Absolute: 0.5 10*3/uL (ref 0.1–1.0)
Monocytes Relative: 5 %
Neutro Abs: 7.3 10*3/uL (ref 1.7–7.7)
Neutrophils Relative %: 78 %
Platelets: 288 10*3/uL (ref 150–400)
RBC: 3.98 MIL/uL (ref 3.87–5.11)
RDW: 12.4 % (ref 11.5–15.5)
WBC: 9.5 10*3/uL (ref 4.0–10.5)
nRBC: 0 % (ref 0.0–0.2)

## 2023-11-11 LAB — COMPREHENSIVE METABOLIC PANEL WITH GFR
ALT: 14 U/L (ref 0–44)
AST: 17 U/L (ref 15–41)
Albumin: 3.8 g/dL (ref 3.5–5.0)
Alkaline Phosphatase: 35 U/L — ABNORMAL LOW (ref 38–126)
Anion gap: 8 (ref 5–15)
BUN: 12 mg/dL (ref 6–20)
CO2: 20 mmol/L — ABNORMAL LOW (ref 22–32)
Calcium: 8.9 mg/dL (ref 8.9–10.3)
Chloride: 106 mmol/L (ref 98–111)
Creatinine, Ser: 0.85 mg/dL (ref 0.44–1.00)
GFR, Estimated: >60 mL/min (ref >60)
Glucose, Bld: 113 mg/dL — ABNORMAL HIGH (ref 70–99)
Potassium: 3.3 mmol/L — ABNORMAL LOW (ref 3.5–5.1)
Sodium: 134 mmol/L — ABNORMAL LOW (ref 135–145)
Total Bilirubin: 0.5 mg/dL (ref 0.0–1.2)
Total Protein: 7.7 g/dL (ref 6.5–8.1)

## 2023-11-11 LAB — URINALYSIS, ROUTINE W REFLEX MICROSCOPIC
Bilirubin Urine: NEGATIVE
Glucose, UA: NEGATIVE mg/dL
Hgb urine dipstick: NEGATIVE
Ketones, ur: NEGATIVE mg/dL
Nitrite: NEGATIVE
Protein, ur: NEGATIVE mg/dL
Specific Gravity, Urine: 1.024 (ref 1.005–1.030)
pH: 7 (ref 5.0–8.0)

## 2023-11-11 LAB — RAPID URINE DRUG SCREEN, HOSP PERFORMED
Amphetamines: NOT DETECTED
Barbiturates: NOT DETECTED
Benzodiazepines: NOT DETECTED
Cocaine: NOT DETECTED
Opiates: NOT DETECTED
Tetrahydrocannabinol: POSITIVE — AB

## 2023-11-11 LAB — HCG, SERUM, QUALITATIVE: Preg, Serum: NEGATIVE

## 2023-11-11 LAB — LIPASE, BLOOD: Lipase: 33 U/L (ref 11–51)

## 2023-11-11 MED ORDER — ONDANSETRON HCL 4 MG/2ML IJ SOLN
4.0000 mg | Freq: Once | INTRAMUSCULAR | Status: AC
  Administered 2023-11-11: 4 mg via INTRAVENOUS
  Filled 2023-11-11: qty 2

## 2023-11-11 MED ORDER — HYDROMORPHONE HCL 1 MG/ML IJ SOLN
0.5000 mg | Freq: Once | INTRAMUSCULAR | Status: AC
  Administered 2023-11-11: 0.5 mg via INTRAVENOUS
  Filled 2023-11-11: qty 1

## 2023-11-11 MED ORDER — IOHEXOL 300 MG/ML  SOLN
100.0000 mL | Freq: Once | INTRAMUSCULAR | Status: AC | PRN
  Administered 2023-11-11: 100 mL via INTRAVENOUS

## 2023-11-11 NOTE — ED Provider Notes (Signed)
 [place] [facility] AT [facility] Provider Note   CSN: [id number] Arrival date & time: 11/11/23  1001     History  Chief Complaint  Patient presents with   Abdominal Pain    Rebecca Dyer is a 33 y.o. female.  HPI Reports reports some pain several days ago with diffuse aching discomfort in her central abdomen.  At that time she did not think too much about it but it has progressed to be intensely painful and also was a lot of rectal pain and perception of need to have a bowel movement.  She reports that comes in spasms and waves of pain.  She is experiencing nausea but not actively vomiting.  She denies any prior history of similar episodes.  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  She reports also having had a hernia repair.  Patient reports she is sexually active with females only and does not have other intercourse.  She reports regular menstrual cycles.  No atypical bleeding or vaginal discharge.    Home Medications Prior to Admission medications   Medication Sig Start Date End Date Taking? Authorizing Provider  ibuprofen (ADVIL) 100 MG/5ML suspension Take 300 mg by mouth every 6 (six) hours as needed (For back pain).    [provider]  Magnesium Citrate (MAGNESIUM GUMMIES PO) Take by mouth.    [provider]  Multiple Vitamins-Minerals (MULTIVITAMIN GUMMIES ADULTS PO) Take by mouth.    [provider]  OVER THE COUNTER MEDICATION Take 10 mg by mouth at bedtime as needed (For sleep). Melatonin Fast Dissolve 10mg  Tablet    [provider]      Allergies    Duricef [cefadroxil monohydrate] and Latex    Review of Systems   Review of Systems  Physical Exam Updated Vital Signs BP 109/79 (BP Location: Right Arm)   Pulse 62   Temp 98 F (36.7 C) (Oral)   Resp 16   SpO2 100%  Physical Exam Constitutional:      Comments: Alert nontoxic.  Uncomfortable in appearance.  Clear  mental status.  No respiratory distress.  HENT:     Head: Normocephalic and atraumatic.     Mouth/Throat:     Pharynx: Oropharynx is clear.  Cardiovascular:     Rate and Rhythm: Normal rate and regular rhythm.  Pulmonary:     Effort: Pulmonary effort is normal.     Breath sounds: Normal breath sounds.  Abdominal:     Comments: Abdomen feels mildly distended in the lower abdomen.  Significantly reproducible lower abdominal pain diffusely.  Musculoskeletal:        General: No swelling or tenderness. Normal range of motion.     Right lower leg: No edema.     Left lower leg: No edema.     Comments: Patient is ambulatory normal gait.  Skin:    General: Skin is warm and dry.  Neurological:     General: No focal deficit present.     Mental Status: She is oriented to person, place, and time.     Motor: No weakness.     Coordination: Coordination normal.  Psychiatric:        Mood and Affect: Mood normal.     ED Results / Procedures / Treatments   Labs (all labs ordered are listed, but only abnormal results are displayed) Labs Reviewed  URINALYSIS, ROUTINE W REFLEX MICROSCOPIC - Abnormal; Notable for the following components:  Result Value   APPearance HAZY (*)    Leukocytes,Ua TRACE (*)    Bacteria, UA FEW (*)    All other components within normal limits  COMPREHENSIVE METABOLIC PANEL WITH GFR - Abnormal; Notable for the following components:   Sodium 134 (*)    Potassium 3.3 (*)    CO2 20 (*)    Glucose, Bld 113 (*)    Alkaline Phosphatase 35 (*)    All other components within normal limits  RAPID URINE DRUG SCREEN, HOSP PERFORMED - Abnormal; Notable for the following components:   Tetrahydrocannabinol POSITIVE (*)    All other components within normal limits  HCG, SERUM, QUALITATIVE  CBC WITH DIFFERENTIAL/PLATELET  LIPASE, BLOOD  CBC WITH DIFFERENTIAL/PLATELET    EKG None  Radiology No results found.  Procedures Procedures    Medications Ordered in  ED Medications  HYDROmorphone (DILAUDID) injection 0.5 mg (0.5 mg Intravenous Given 11/11/23 1329)  ondansetron (ZOFRAN) injection 4 mg (4 mg Intravenous Given 11/11/23 1328)  iohexol (OMNIPAQUE) 300 MG/ML solution 100 mL (100 mLs Intravenous Contrast Given 11/11/23 1438)    ED Course/ Medical Decision Making/ A&P                                 Medical Decision Making Amount and/or Complexity of Data Reviewed Labs: ordered. Radiology: ordered.  Risk Prescription drug management.   Patient is had abdominal pain worsening over several days.  At this time is most intense in the left lower quadrant.  Exam patient has diffusely tender lower abdomen.  Differential diagnosis includes appendicitis\obstruction\kidney stone\UTI\pelvic infection\torsion.  At this time I have lower suspicion for torsion given the indolent onset over a couple days duration and migratory nature.  The patient's urine pregnancy test is negative.  Patient denies sexual contact with female partners.  Lower probability of pelvic inflammatory disease.  This time we will proceed with CT scan to rule out appendicitis or bowel obstruction.  White count normal with normal differential.  GFR greater than 60.  Lipase 33.  LFTs within normal limits.  Serum qualitative pregnancy negative.  UDS positive THC.  Urinalysis negative except for 6-10 white cells but contaminated specimen with 11-20 squamous epithelial cells.  This time I have lower suspicion for UTI without dysuria or urgency.  Patient does test positive THC.  This might be etiology for abdominal pain although she is not having copious vomiting and more of lower abdominal pain and cramping.  CT imaging pending.  Will need CT imaging for disposition.  Dr. Elise Benne to follow-up on CT results for final disposition.        Final Clinical Impression(s) / ED Diagnoses Final diagnoses:  Lower abdominal pain    Rx / DC Orders ED Discharge Orders     None          Arby Barrette, MD 11/11/23 1544

## 2023-11-11 NOTE — Discharge Instructions (Signed)
 We saw you in the ER for abdominal discomfort. The results of our workup, including labs and imaging are reassuring at this time from emergency perspective, but you did have a large fibroid and some non specific liver lesions - both will need attention.  Call Gyne doctor at the number provided for the fibroid. Call PCP for liver lesions.   Symptoms can evolve, therefore please return to the ER if you have increased pain, fevers, chills, inability to keep any medications down, confusion, sweating. Otherwise see your primary care doctor in 2-3 days for further evaluation.

## 2023-11-11 NOTE — ED Provider Notes (Addendum)
  Physical Exam  BP 109/79 (BP Location: Right Arm)   Pulse 62   Temp 98 F (36.7 C) (Oral)   Resp 16   SpO2 100%   Physical Exam  Procedures  Procedures  ED Course / MDM    Medical Decision Making Amount and/or Complexity of Data Reviewed Labs: ordered. Radiology: ordered.  Risk Prescription drug management.   Assuming care of patient from Dr. Clarice Pole.   Patient in the ED for abd pain. Workup thus far shows normal labs.  Concerning findings are as following - none Important pending results are CT abd/pelvis  According to Dr. Clarice Pole, plan is to d/c if CT neg. Pt has female partner. No concerns for pid.    Patient had no complains, no concerns from the nursing side. Will continue to monitor.  4:49 PM The patient appears reasonably screened and/or stabilized for discharge and I doubt any other medical condition or other [facility] requiring further screening, evaluation, or treatment in the ED at this time prior to discharge.   Results from the ER workup discussed with the patient face to face and all questions answered to the best of my ability.  Patient made aware that her CT scan shows a large fibroid, that could be the cause for her pain.  She will start taking ibuprofen and Tylenol around-the-clock.  We have given her follow-up information on gynecologist.  I suspect that the pain is likely because of the fibroid given the location is in the lower quadrant.  Currently patient is comfortable and has no peritonitis. She also has nonspecific liver lesions on CT scan that were discussed.  Advised that she follows up with her PCP for surveillance. The patient is safe for discharge with strict return precautions.    Derwood Kaplan, MD 11/11/23 0272    Derwood Kaplan, MD 11/11/23 1650

## 2023-11-11 NOTE — ED Triage Notes (Signed)
 Arrives pov with family c/o LLQ abdominal pain since this morning. Endorses nausea and vomiting. Has not had a bm.

## 2023-11-13 ENCOUNTER — Encounter: Payer: Self-pay | Admitting: Internal Medicine

## 2023-11-13 DIAGNOSIS — K769 Liver disease, unspecified: Secondary | ICD-10-CM | POA: Insufficient documentation

## 2023-11-13 NOTE — Progress Notes (Unsigned)
 Subjective:    Patient ID: Rebecca Dyer, female    DOB: 07-16-1991, 33 y.o.   MRN: [id number]     HPI Rebecca Dyer is here for follow up from the hospital   ED 4/1 for abdominal pain -she had a few days of intense pain in her central abdomen.  When the pain started it felt like she was going to get her period.  She was also experiencing rectal pain and perception of needing to have a bowel movement.  That would come and spasms in waves of pain.  She had nausea, and did vomit yellow.  She has regular menstrual cycles.  She denies atypical bleeding or vaginal discharge.  She was diffusely tender on exam.  Urine Prag negative.  Urine, CBC, CMP, lipase normal.  UDS positive for THC.  CT abdomen and pelvis-10 cm uterine fibroid which is new since 2011.  Stable tiny periumbilical ventral hernia containing fat.  Multiple small hypervascular liver lesions which appear new since 2011 and are indeterminant.  Recommended nonemergent MRI with and without contrast for further characterization   She has not had pain since the ED.  She feels the lower abdomen hard.  Was supposed to started menses a couple of days ago but has not.  She has been having normal BM's.  She is concerned to do her regular activities if she is concerned the pain may come back.  Medications and allergies reviewed with patient and updated if appropriate.  Current Outpatient Medications on File Prior to Visit  Medication Sig Dispense Refill   ibuprofen (ADVIL) 100 MG/5ML suspension Take 300 mg by mouth every 6 (six) hours as needed (For back pain).     Magnesium Citrate (MAGNESIUM GUMMIES PO) Take by mouth.     Multiple Vitamins-Minerals (MULTIVITAMIN GUMMIES ADULTS PO) Take by mouth.     OVER THE COUNTER MEDICATION Take 10 mg by mouth at bedtime as needed (For sleep). Melatonin Fast Dissolve 10mg  Tablet     No current facility-administered medications on file prior to visit.     Review of Systems  Constitutional:   Negative for appetite change and fever.       Feeling hot  Gastrointestinal:  Negative for abdominal distention, abdominal pain, blood in stool (no melena), constipation, diarrhea, nausea and vomiting.       Firmness in lower abdomen  Genitourinary:  Negative for dysuria, frequency, hematuria and urgency.  Neurological:  Positive for light-headedness (occ) and headaches (intermittent sharp head pain - transient).       Objective:   Vitals:   11/14/23 1059  BP: 108/78  Pulse: 70  Temp: 98.1 F (36.7 C)  SpO2: 100%   BP Readings from Last 3 Encounters:  11/14/23 108/78  11/11/23 109/79  09/08/23 116/72   Wt Readings from Last 3 Encounters:  11/14/23 170 lb (77.1 kg)  09/08/23 175 lb (79.4 kg)  10/30/22 159 lb (72.1 kg)   Body mass index is 30.11 kg/m.    Physical Exam Constitutional:      General: She is not in acute distress.    Appearance: Normal appearance.  HENT:     Head: Normocephalic and atraumatic.  Eyes:     Conjunctiva/sclera: Conjunctivae normal.  Cardiovascular:     Rate and Rhythm: Normal rate and regular rhythm.     Heart sounds: Normal heart sounds.  Pulmonary:     Effort: Pulmonary effort is normal. No respiratory distress.     Breath sounds: Normal breath  sounds. No wheezing.  Abdominal:     General: There is no distension.     Palpations: Abdomen is soft. There is mass (Lower abdominal mass-likely fibroid-palpable and nontender).     Tenderness: There is no abdominal tenderness. There is no guarding or rebound.  Musculoskeletal:     Cervical back: Neck supple.     Right lower leg: No edema.     Left lower leg: No edema.  Lymphadenopathy:     Cervical: No cervical adenopathy.  Skin:    General: Skin is warm and dry.     Findings: No rash.  Neurological:     Mental Status: She is alert. Mental status is at baseline.  Psychiatric:        Mood and Affect: Mood normal.        Behavior: Behavior normal.        Lab Results  Component  Value Date   WBC 9.5 11/11/2023   HGB 12.8 11/11/2023   HCT 37.5 11/11/2023   PLT 288 11/11/2023   GLUCOSE 113 (H) 11/11/2023   CHOL 175 06/06/2021   TRIG 28 06/06/2021   HDL 50 06/06/2021   LDLCALC 119 (H) 06/06/2021   ALT 14 11/11/2023   AST 17 11/11/2023   NA 134 (L) 11/11/2023   K 3.3 (L) 11/11/2023   CL 106 11/11/2023   CREATININE 0.85 11/11/2023   BUN 12 11/11/2023   CO2 20 (L) 11/11/2023   TSH 1.46 09/08/2023   HGBA1C 5.2 09/08/2023   CT ABDOMEN PELVIS W CONTRAST CLINICAL DATA:  Left lower quadrant pain beginning this morning. Nausea and vomiting.  EXAM: CT ABDOMEN AND PELVIS WITH CONTRAST  TECHNIQUE: Multidetector CT imaging of the abdomen and pelvis was performed using the standard protocol following bolus administration of intravenous contrast.  RADIATION DOSE REDUCTION: This exam was performed according to the departmental dose-optimization program which includes automated exposure control, adjustment of the mA and/or kV according to patient size and/or use of iterative reconstruction technique.  CONTRAST:  OMNIPAQUE IOHEXOL 300 MG/ML  SOLN  COMPARISON:  12/08/2009  FINDINGS: Lower Chest: No acute findings.  Hepatobiliary: 2 small hypervascular masses are seen in the posterior right hepatic lobe which measure 1.9 cm and 1.2 cm in diameter. A probable 3rd hypervascular lesion measuring 11 mm is seen in segment 2 of the left lobe. A 12 mm lesion with possible nodular enhancement is also seen in the lateral right hepatic lobe. These lesions were not definitely seen on prior CT, and are indeterminate.  Gallbladder is unremarkable. No evidence of biliary ductal dilatation.  Pancreas:  No mass or inflammatory changes.  Spleen: Within normal limits in size and appearance.  Adrenals/Urinary Tract: No suspicious masses identified. No evidence of ureteral calculi or hydronephrosis. Unremarkable unopacified urinary bladder.  Stomach/Bowel: No  evidence of obstruction, inflammatory process or abnormal fluid collections.  Vascular/Lymphatic: No pathologically enlarged lymph nodes. No acute vascular findings.  Reproductive: Large fibroid is seen in the left posterior uterine fundus which measures 9.9 x 9.4 cm. This shows diffuse heterogeneous enhancement, and is new since 2011. No other pelvic mass or inflammatory process identified. Tiny amount of free fluid noted.  Other: A tiny paraumbilical ventral hernia is again seen which contains only fat.  Musculoskeletal:  No suspicious bone lesions identified.  IMPRESSION: 10 cm uterine fibroid, new since 2011 exam.  Stable tiny paraumbilical ventral hernia, which contains only fat.  Multiple small hypervascular liver lesions, which appear new since 2011 exam and are indeterminate.  Recommend nonemergent outpatient abdomen MRI without and with contrast for further characterization.  Electronically Signed   By: Danae Orleans M.D.   On: 11/11/2023 16:30    Assessment & Plan:    See Problem List for Assessment and Plan of chronic medical problems.    I spent 20 minutes dedicated to the care of this patient on the date of this encounter including review of recent labs, imaging , ED notes, obtaining history, communicating with the patient, tests, and documenting clinical information in the EHR

## 2023-11-13 NOTE — Patient Instructions (Incomplete)
    Follow-up with your gynecologist regarding the fibroid     Medications changes include :   None    A MRI of your liver was ordered and someone will call you to schedule an appointment.

## 2023-11-14 ENCOUNTER — Ambulatory Visit (INDEPENDENT_AMBULATORY_CARE_PROVIDER_SITE_OTHER): Admitting: Internal Medicine

## 2023-11-14 VITALS — BP 108/78 | HR 70 | Temp 98.1°F | Ht 63.0 in | Wt 170.0 lb

## 2023-11-14 DIAGNOSIS — D219 Benign neoplasm of connective and other soft tissue, unspecified: Secondary | ICD-10-CM | POA: Diagnosis not present

## 2023-11-14 DIAGNOSIS — R1030 Lower abdominal pain, unspecified: Secondary | ICD-10-CM | POA: Diagnosis not present

## 2023-11-14 DIAGNOSIS — D259 Leiomyoma of uterus, unspecified: Secondary | ICD-10-CM

## 2023-11-14 DIAGNOSIS — K769 Liver disease, unspecified: Secondary | ICD-10-CM

## 2023-11-14 DIAGNOSIS — R109 Unspecified abdominal pain: Secondary | ICD-10-CM | POA: Insufficient documentation

## 2023-11-14 NOTE — Assessment & Plan Note (Signed)
 New 10 cm uterine fibroid seen on CT scan from the emergency room-this is new since 2011 She does have an appointment with a new gynecologist in June and can discuss that with him

## 2023-11-14 NOTE — Assessment & Plan Note (Signed)
 Acute Resolved She had intense abdominal pain the day she went to the emergency room CT scan besides the fibroid and liver lesions did not offer an explanation for the pain Pain has resolved and she is feeling much better Unfortunately I cannot let her know the cause of the pain Reassured her that the CT scan was reassuring in some ways She will let me know if she has any other instances of pain

## 2023-11-14 NOTE — Assessment & Plan Note (Signed)
 New Recent CT in ED showed multiple small hypervascular liver lesions which are new compared to 2011 MRI without and with contrast advised and ordered

## 2023-11-18 ENCOUNTER — Inpatient Hospital Stay: Admitting: Internal Medicine

## 2023-12-03 ENCOUNTER — Other Ambulatory Visit

## 2024-01-21 ENCOUNTER — Other Ambulatory Visit (HOSPITAL_COMMUNITY)
Admission: RE | Admit: 2024-01-21 | Discharge: 2024-01-21 | Disposition: A | Discharge status: Home / Self Care | Source: Ambulatory Visit | Attending: Obstetrics & Gynecology | Admitting: Obstetrics & Gynecology

## 2024-01-21 ENCOUNTER — Ambulatory Visit: Admitting: Obstetrics & Gynecology

## 2024-01-21 ENCOUNTER — Encounter: Payer: Self-pay | Admitting: Obstetrics & Gynecology

## 2024-01-21 VITALS — BP 115/78 | HR 81 | Ht 63.0 in | Wt 177.0 lb

## 2024-01-21 DIAGNOSIS — Z803 Family history of malignant neoplasm of breast: Secondary | ICD-10-CM | POA: Diagnosis not present

## 2024-01-21 DIAGNOSIS — Z113 Encounter for screening for infections with a predominantly sexual mode of transmission: Secondary | ICD-10-CM

## 2024-01-21 DIAGNOSIS — D259 Leiomyoma of uterus, unspecified: Secondary | ICD-10-CM | POA: Diagnosis not present

## 2024-01-21 DIAGNOSIS — Z01419 Encounter for gynecological examination (general) (routine) without abnormal findings: Secondary | ICD-10-CM | POA: Insufficient documentation

## 2024-01-21 NOTE — Progress Notes (Signed)
 Pt is new to office needing routine GYN exam.  Pt had CT scan in April with 9x9 fibroid noted. Pt has long, heavy cycles lasting 5-7 days. Pt may have some some irregular spotting after cycle.  Pt also has some urinary concerns.

## 2024-01-21 NOTE — Progress Notes (Signed)
 GYNECOLOGY CLINIC ANNUAL PREVENTATIVE CARE ENCOUNTER NOTE  Subjective:f/u for dx of uterine fibroid    Rebecca Dyer is a 33 y.o. G0P0 female here for a routine annual gynecologic exam.  Current complaints: had episode of abdominal pain and nausea and vomiting and at ED visit uterine fibroid was diagnosed.   Denies abnormal vaginal bleeding, discharge, with occasional  pelvic pain,and moderate to heavy menses. Currently same sex relationship and has no need for contraceptive   Gynecologic History Patient's last menstrual period was 12/24/2023 (approximate). Contraception: abstinence Last Pap: unsure. Results were: normal   Obstetric History OB History  Gravida Para Term Preterm AB Living  0 0      SAB IAB Ectopic Multiple Live Births          Past Medical History:  Diagnosis Date   ADHD (attention deficit hyperactivity disorder)    Depression    GERD (gastroesophageal reflux disease)    IBS (irritable bowel syndrome)     Past Surgical History:  Procedure Laterality Date   pyloric stenosis  1994   UMBILICAL HERNIA REPAIR  1998   as a child   WISDOM TOOTH EXTRACTION Bilateral 2010    Current Outpatient Medications on File Prior to Visit  Medication Sig Dispense Refill   Magnesium  Citrate (MAGNESIUM  GUMMIES PO) Take by mouth.     ibuprofen  (ADVIL ) 100 MG/5ML suspension Take 300 mg by mouth every 6 (six) hours as needed (For back pain).     Multiple Vitamins-Minerals (MULTIVITAMIN GUMMIES ADULTS PO) Take by mouth.     OVER THE COUNTER MEDICATION Take 10 mg by mouth at bedtime as needed (For sleep). Melatonin Fast Dissolve 10mg  Tablet     No current facility-administered medications on file prior to visit.    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   Tobacco Use   Smoking status: Former   Smokeless tobacco: Never  Advertising account planner   Vaping status: Some Days  Substance and Sexual Activity   Alcohol use: Yes    Comment: socially   Drug use: Yes    Types: Marijuana   Sexual activity: Not Currently    Birth control/protection: Condom    Comment: 1st intercourse 33 yo-More than 5 partners  Other Topics Concern   Not on file  Social History Narrative   Sexual preference: females    Did one year of college   Household:   At home is the patient & her girlfriend     Social Drivers of Corporate investment banker Strain: High Risk (09/08/2023)   Overall Financial Resource Strain (CARDIA)    Difficulty of Paying Living Expenses: Very hard  Food Insecurity: Food Insecurity Present (09/08/2023)   Hunger Vital Sign    Worried About Running Out of Food in the Last Year: Often true    Ran Out of Food in the Last Year: Sometimes true  Transportation Needs: No Transportation Needs (09/08/2023)   PRAPARE - Administrator, Civil Service (Medical): No    Lack of Transportation (Non-Medical): No  Physical Activity: Sufficiently Active (09/08/2023)   Exercise Vital Sign    Days of Exercise per Week: 3 days    Minutes of Exercise per Session: 150+ min  Stress: No Stress Concern Present (09/08/2023)   [employer] of Occupational Health - Occupational Stress Questionnaire    Feeling of Stress : Only a little  Social Connections: Moderately Isolated (09/08/2023)   Social Connection and Isolation Panel [NHANES]    Frequency of Communication with Friends and Family: Three times a week    Frequency of Social Gatherings with Friends and Family: Once a week    Attends Religious Services: Never    Database administrator or Organizations: Yes    Attends Hospital doctor: More than 4 times per year    Marital Status: Divorced  Catering manager Violence: Not on file    Family History  Adopted: Yes  Problem Relation Age of Onset   Hypertension Mother    Drug abuse Mother    Drug abuse Father    Breast cancer Maternal Aunt        age unknown   Colon cancer Neg Hx     The following portions of the patient's history were reviewed and updated as appropriate: allergies, current medications, past family history, past medical history, past social history, past surgical history and problem list.  Review of Systems Pertinent items are noted in HPI.   Objective:  BP 115/78   Pulse 81   Ht 5' 3 (1.6 m)   Wt 177 lb (80.3 kg)   LMP 12/24/2023 (Approximate)   BMI 31.35 kg/m  CONSTITUTIONAL: Well-developed, well-nourished female in no acute distress.  HENT:  Normocephalic, atraumatic, External right and left ear normal. Oropharynx is clear and moist EYES: Conjunctivae and EOM are normal. Pupils are equal, round, and reactive to light. No scleral icterus.  NECK: Normal range of motion, supple, no masses.  Normal thyroid .  SKIN: Skin is warm and dry. No rash noted. Not diaphoretic. No erythema. No pallor. NEUROLGIC: Alert and oriented to person, place, and time. Normal reflexes, muscle tone coordination. No cranial nerve deficit noted. PSYCHIATRIC: Normal mood and affect. Normal behavior. Normal judgment and thought content. CARDIOVASCULAR: Normal heart rate noted, regular rhythm RESPIRATORY: Effort normal, no problems with respiration noted. BREASTS: Symmetric in size. No masses, skin changes, nipple drainage, or lymphadenopathy. ABDOMEN: Soft, normal bowel sounds, Firm mass to below umbilicus PELVIC: Normal appearing external genitalia; normal appearing vaginal mucosa and cervix.  No abnormal discharge noted.  Pap smear obtained.  Uterus 15 week size MUSCULOSKELETAL: Normal range of motion. No tenderness.  No cyanosis, clubbing, or edema Narrative  & Impression  CLINICAL DATA:  Left lower quadrant pain beginning this morning. Nausea and vomiting.   EXAM: CT ABDOMEN AND PELVIS WITH CONTRAST   TECHNIQUE: Multidetector CT imaging of the abdomen and pelvis was performed using the standard protocol following bolus administration of intravenous contrast.   RADIATION DOSE REDUCTION: This exam was performed according to the departmental dose-optimization program which includes automated exposure control, adjustment of the mA and/or kV according to patient size and/or use of iterative reconstruction technique.   CONTRAST:  OMNIPAQUE  IOHEXOL  300 MG/ML  SOLN   COMPARISON:  12/08/2009   FINDINGS: Lower Chest: No acute findings.   Hepatobiliary: 2 small hypervascular masses are seen in the posterior right hepatic lobe which measure 1.9 cm and 1.2 cm in diameter. A probable 3rd hypervascular lesion measuring 11 mm is seen in segment 2 of  the left lobe. A 12 mm lesion with possible nodular enhancement is also seen in the lateral right hepatic lobe. These lesions were not definitely seen on prior CT, and are indeterminate.   Gallbladder is unremarkable. No evidence of biliary ductal dilatation.   Pancreas:  No mass or inflammatory changes.   Spleen: Within normal limits in size and appearance.   Adrenals/Urinary Tract: No suspicious masses identified. No evidence of ureteral calculi or hydronephrosis. Unremarkable unopacified urinary bladder.   Stomach/Bowel: No evidence of obstruction, inflammatory process or abnormal fluid collections.   Vascular/Lymphatic: No pathologically enlarged lymph nodes. No acute vascular findings.   Reproductive: Large fibroid is seen in the left posterior uterine fundus which measures 9.9 x 9.4 cm. This shows diffuse heterogeneous enhancement, and is new since 2011. No other pelvic mass or inflammatory process identified. Tiny amount of free fluid noted.   Other: A tiny paraumbilical  ventral hernia is again seen which contains only fat.   Musculoskeletal:  No suspicious bone lesions identified.   IMPRESSION: 10 cm uterine fibroid, new since 2011 exam.   Stable tiny paraumbilical ventral hernia, which contains only fat.   Multiple small hypervascular liver lesions, which appear new since 2011 exam and are indeterminate. Recommend nonemergent outpatient abdomen MRI without and with contrast for further characterization.     Electronically Signed   By: Marlyce Sine M.D.   On: 11/11/2023 16:30      Assessment:  Annual gynecologic examination with pap smear   Plan:  Will follow up results of pap smear and manage accordingly. F/u for consult re possible Robotic myomectomy with Dr. Elester Grim Routine preventative health maintenance measures emphasized. Please refer to After Visit Summary for other counseling recommendations.    Onnie Bilis, MD Attending Obstetrician & Gynecologist Center for [employer], [facility]

## 2024-01-22 LAB — RPR: RPR Ser Ql: NONREACTIVE

## 2024-01-22 LAB — HEPATITIS B SURFACE ANTIGEN: Hepatitis B Surface Ag: NEGATIVE

## 2024-01-22 LAB — CYTOLOGY - PAP
Adequacy: ABSENT
Comment: NEGATIVE
Diagnosis: NEGATIVE
High risk HPV: NEGATIVE

## 2024-01-22 LAB — HIV ANTIBODY (ROUTINE TESTING W REFLEX): HIV Screen 4th Generation wRfx: NONREACTIVE

## 2024-01-22 LAB — HEPATITIS C ANTIBODY: Hep C Virus Ab: NONREACTIVE

## 2024-01-28 ENCOUNTER — Ambulatory Visit (INDEPENDENT_AMBULATORY_CARE_PROVIDER_SITE_OTHER): Payer: Self-pay | Admitting: Licensed Clinical Social Worker

## 2024-01-28 DIAGNOSIS — F4321 Adjustment disorder with depressed mood: Secondary | ICD-10-CM

## 2024-01-28 NOTE — BH Specialist Note (Unsigned)
 Integrated Behavioral Health via Telemedicine Visit  01/28/2024 Rebecca Dyer [id number]  Number of Integrated Behavioral Health Clinician visits: No data recorded Session Start time: No data recorded  Session End time: No data recorded Total time in minutes: No data recorded   Referring Provider: *** Patient/Family location: *** [facility] Provider location: *** All persons participating in visit: *** Types of Service: {CHL AMB TYPE OF SERVICE:[phone]}  I connected with Rebecca Dyer and/or Rebecca Dyer's {family members:20773} via  Telephone or Video Enabled Telemedicine Application  (Video is Caregility application) and verified that I am speaking with the correct person using two identifiers. Discussed confidentiality: {YES/NO:21197}  I discussed the limitations of telemedicine and the availability of in person appointments.  Discussed there is a possibility of technology failure and discussed alternative modes of communication if that failure occurs.  I discussed that engaging in this telemedicine visit, they consent to the provision of behavioral healthcare and the services will be billed under their insurance.  Patient and/or legal guardian expressed understanding and consented to Telemedicine visit: {YES/NO:21197}  Presenting Concerns: Patient and/or family reports the following symptoms/concerns: *** Duration of problem: ***; Severity of problem: {Mild/Moderate/Severe:20260}  Patient and/or Family's Strengths/Protective Factors: {CHL AMB BH PROTECTIVE FACTORS:[phone]}  Goals Addressed: Patient will:  Reduce symptoms of: {IBH Symptoms:21014056}   Increase knowledge and/or ability of: {IBH Patient Tools:21014057}   Demonstrate ability to: {IBH Goals:21014053}  Progress towards Goals: {CHL AMB BH PROGRESS TOWARDS GOALS:[phone]}    Interventions: Interventions utilized:  {IBH Interventions:21014054} Standardized Assessments completed: {IBH Screening  Tools:21014051}    Patient and/or Family Response: Ex wife.. recently divorced.. first apartment last April.  Girlfriend who lives in [place]   July of 2024-- [employer] contract ended last year and let her go. International aid/development worker at [employer].. Does get benefits (foodstamps)  She is adopted at birth.. ..   Bio mother passed away in 02-07-21 from a drug overdose.. she was able to meet most of her siblings at the funeral.   Rebecca Dyer like she is always depressed..   Clinical Assessment/Diagnosis  No diagnosis found.    Assessment: Patient currently experiencing ***.   Patient may benefit from ***.  Plan: Follow up with behavioral health clinician on : *** Behavioral recommendations: *** Referral(s): {IBH Referrals:21014055}  I discussed the assessment and treatment plan with the patient and/or parent/guardian. They were provided an opportunity to ask questions and all were answered. They agreed with the plan and demonstrated an understanding of the instructions.   They were advised to call back or seek an in-person evaluation if the symptoms worsen or if the condition fails to improve as anticipated.  Rebecca Dyer, LCSWA

## 2024-02-02 LAB — EMPOWER MULTI-CANCER (2 + 38): REPORT SUMMARY: NEGATIVE

## 2024-02-06 ENCOUNTER — Ambulatory Visit: Admitting: Obstetrics and Gynecology

## 2024-02-06 ENCOUNTER — Encounter: Payer: Self-pay | Admitting: Obstetrics and Gynecology

## 2024-02-06 ENCOUNTER — Ambulatory Visit: Admitting: Licensed Clinical Social Worker

## 2024-02-06 VITALS — BP 110/72 | HR 71 | Ht 63.0 in | Wt 171.6 lb

## 2024-02-06 DIAGNOSIS — N939 Abnormal uterine and vaginal bleeding, unspecified: Secondary | ICD-10-CM | POA: Diagnosis not present

## 2024-02-06 DIAGNOSIS — D259 Leiomyoma of uterus, unspecified: Secondary | ICD-10-CM | POA: Diagnosis not present

## 2024-02-06 DIAGNOSIS — F4321 Adjustment disorder with depressed mood: Secondary | ICD-10-CM

## 2024-02-06 NOTE — BH Specialist Note (Unsigned)
 Integrated Behavioral Health via Telemedicine Visit  02/10/2024 Rebecca Dyer [id number]  Number of Integrated Behavioral Health Clinician visits: 2- Second Visit  Session Start time: 1045   Session End time: 1110  Total time in minutes: 25    Referring Provider: Dr. Eveline  Patient/Family location: In Her Car  [facility] Provider location: Remote Office  All persons participating in visit: [facility] and Patient  Types of Service: Individual psychotherapy and Video visit  I connected with Rebecca Dyer and/or Rebecca Dyer's patient via  Telephone or Video Enabled Telemedicine Application  (Video is Caregility application) and verified that I am speaking with the correct person using two identifiers. Discussed confidentiality: Yes   I discussed the limitations of telemedicine and the availability of in person appointments.  Discussed there is a possibility of technology failure and discussed alternative modes of communication if that failure occurs.  I discussed that engaging in this telemedicine visit, they consent to the provision of behavioral healthcare and the services will be billed under their insurance.  Patient and/or legal guardian expressed understanding and consented to Telemedicine visit: Yes   Presenting Concerns: Patient and/or family reports the following symptoms/concerns: ongoing depressive symptoms.  Duration of problem: Months; Severity of problem: moderate  Patient and/or Family's Strengths/Protective Factors: Social and Emotional competence, Concrete supports in place (healthy food, safe environments, etc.), and Physical Health (exercise, healthy diet, medication compliance, etc.)  Goals Addressed: Patient will:  Reduce symptoms of: depression   Increase knowledge and/or ability of: coping skills, healthy habits, and self-management skills   Demonstrate ability to: Increase healthy adjustment to current life circumstances  Progress towards  Goals: Ongoing    Interventions: Interventions utilized:  Mindfulness or Management consultant, Supportive Counseling, Psychoeducation and/or Health Education, and Supportive Reflection Standardized Assessments completed: Not Needed    Patient and/or Family Response: Patient was present and actively engaged in today's virtual session. She reported having a productive family discussion with her girlfriend and stepson, during which they were able to collaboratively develop a plan for dividing household chores and tasks. Patient expressed satisfaction with the outcome and her ability to communicate her needs effectively. She also reported ongoing experiences of painful menstrual cycles and shared that she recently consulted with a medical provider regarding her symptoms. She was advised to consider surgical options, including fibroid removal or a hysterectomy. [facility] provided psychoeducation on the phases of the menstrual cycle and introduced the concept of cycle syncing, including recommended nutritional and lifestyle practices for each phase to help manage symptoms and support overall well-being.  Clinical Assessment/Diagnosis  Adjustment disorder with depressed mood    Assessment: Patient currently managing chronic menstrual pain and evaluating medical options while also working to improve family communication and household structure. She is beginning to explore holistic strategies to better align her daily routines with her menstrual cycle.   Patient may benefit from integrated behavioral health services.  Plan: Follow up with behavioral health clinician on : 02/16/24 Behavioral recommendations: Continue implementing the household plan to reduce stress and maintain open communication with family members. She is encouraged to track her menstrual cycle, apply cycle syncing strategies, and follow up with her medical provider to make an informed decision about potential treatment  options. Referral(s): Integrated [employer] (In Clinic)  I discussed the assessment and treatment plan with the patient and/or parent/guardian. They were provided an opportunity to ask questions and all were answered. They agreed with the plan and demonstrated an understanding of the instructions.  They were advised to call back or seek an in-person evaluation if the symptoms worsen or if the condition fails to improve as anticipated.  Linford Quintela LITTIE Seats, LCSWA

## 2024-02-06 NOTE — Progress Notes (Signed)
 GYNECOLOGY VISIT  Patient name: Rebecca Dyer MRN [id number]  Date of birth: [birth year] Chief Complaint:   No chief complaint on file.   History:  Rebecca Dyer is a 33 y.o. G0P0 being seen today for AUB.   Discussed the use of AI scribe software for clinical note transcription with the patient, who gave verbal consent to proceed.  History of Present Illness Rebecca Dyer is a 33 year old female who presents with worsening menstrual bleeding and pain due to a fibroid.  She has experienced worsening menstrual bleeding over the past two years, characterized by heavy flow requiring the use of ultra or super plus tampons along with a night pad or super pad, which she changes every two hours while awake. The bleeding is particularly severe during the first two to three days of her period, during which she feels sick and is unable to perform daily activities.  She has a history of cramping, which has been a consistent issue, but notes that the bleeding has become significantly worse. She has tried various treatments in the past, including birth control pills, Nexplanon, and the Depo-Provera  shot. The birth control pills were not effective, and the Nexplanon caused continuous bleeding. The Depo-Provera  shot led to significant weight gain and mood changes, prompting her to discontinue its use at age 49.  A recent scan revealed the presence of a fibroid. She is concerned about her menstrual health and expresses uncertainty about future pregnancy intentions, influenced by the fibroid and a recurring hernia issue that causes stomach problems.  She has a history of hernia repairs, with surgeries performed in infancy and at age [age] or [age]. She currently experiences a painful hernia that has been present since age 61, which cannot be pushed back in. She manages the hernia by limiting food intake to prevent exacerbation of symptoms.  She inquires about the impact of her hormonal status on her deep  voice, wondering if it could be related to her medical conditions. No history of diabetes. No prior pregnancies.    Past Medical History:  Diagnosis Date   ADHD (attention deficit hyperactivity disorder)    Depression    GERD (gastroesophageal reflux disease)    IBS (irritable bowel syndrome)     Past Surgical History:  Procedure Laterality Date   pyloric stenosis  1994   UMBILICAL HERNIA REPAIR  1998   as a child   WISDOM TOOTH EXTRACTION Bilateral 2010    The following portions of the patient's history were reviewed and updated as appropriate: allergies, current medications, past family history, past medical history, past social history, past surgical history and problem list.   Health Maintenance:   Last pap     Component Value Date/Time   DIAGPAP  01/21/2024 1502    - Negative for intraepithelial lesion or malignancy (NILM)   HPVHIGH Negative 01/21/2024 1502   ADEQPAP  01/21/2024 1502    Satisfactory for evaluation; transformation zone component ABSENT.    High Risk HPV: Positive  Adequacy:  Satisfactory for evaluation, transformation zone component PRESENT  Diagnosis:  Atypical squamous cells of undetermined significance (ASC-US ) Last mammogram: n/a   Review of Systems:  Pertinent items are noted in HPI. Comprehensive review of systems was otherwise negative.   Objective:  Physical Exam BP 110/72   Pulse 71   Ht 5' 3 (1.6 m)   Wt 171 lb 9.6 oz (77.8 kg)   LMP 01/29/2024 (Exact Date)   BMI 30.40 kg/m  Physical Exam Vitals and nursing note reviewed. Exam conducted with a chaperone present.  Constitutional:      Appearance: Normal appearance.  HENT:     Head: Normocephalic and atraumatic.  Pulmonary:     Effort: Pulmonary effort is normal.     Breath sounds: Normal breath sounds.  Abdominal:     Comments: RUQ horizontal scar  Genitourinary:    General: Normal vulva.     Exam position: Lithotomy position.     Vagina: Normal.     Cervix:  Normal.   Skin:    General: Skin is warm and dry.   Neurological:     General: No focal deficit present.     Mental Status: She is alert.   Psychiatric:        Mood and Affect: Mood normal.        Behavior: Behavior normal.        Thought Content: Thought content normal.        Judgment: Judgment normal.       Assessment & Plan:   Assessment & Plan Uterine Fibroid Uterine fibroid causing significant menorrhagia and pain. Previous hormonal treatments discontinued due to adverse effects. Surgical options discussed: myomectomy, hysterectomy, uterine artery embolization, and Sonata procedure. Considerations include future fertility, recovery time, and lifestyle impact. - Consider myomectomy or hysterectomy based on her decision. - Discuss referral for uterine artery embolization if chosen. - Educated on surgical options and recovery times. - Discuss potential impact on fertility and menstrual symptoms. - Consider Sonata procedure if appropriate.  Hernia Hernia causing significant discomfort. Consultation with surgeon indicated recurrence risk. Discussed impact of hernia repair on future gynecological surgeries, especially with mesh use. - Coordinate timing of gynecological surgery with potential hernia repair. - Discuss with surgeon regarding hernia repair options and use of mesh.  General Health Maintenance Advised abstaining from alcohol and recreational substances post-surgery, especially with pain medications. - Advise abstaining from alcohol and recreational substances post-surgery.  Follow-up She is considering options for managing uterine fibroid and hernia. Scheduling may be delayed due to OR availability. - Follow up once decision is made regarding surgical options. - Coordinate scheduling of chosen procedure.    Routine preventative health maintenance measures emphasized.  Carter Quarry, MD Minimally Invasive Gynecologic Surgery Center for [facility],  [facility]

## 2024-02-06 NOTE — Progress Notes (Signed)
 Here for consult for myomectomy due to fibroids.   Pt also states small bouts of incontinence following periods past 3-4 months.   Spotting in between periods.

## 2024-02-16 ENCOUNTER — Ambulatory Visit: Payer: Self-pay

## 2024-02-16 ENCOUNTER — Telehealth: Admitting: Physician Assistant

## 2024-02-16 ENCOUNTER — Ambulatory Visit (INDEPENDENT_AMBULATORY_CARE_PROVIDER_SITE_OTHER): Admitting: Licensed Clinical Social Worker

## 2024-02-16 DIAGNOSIS — F4321 Adjustment disorder with depressed mood: Secondary | ICD-10-CM | POA: Diagnosis not present

## 2024-02-16 DIAGNOSIS — L089 Local infection of the skin and subcutaneous tissue, unspecified: Secondary | ICD-10-CM

## 2024-02-16 NOTE — BH Specialist Note (Signed)
 Integrated Behavioral Health via Telemedicine Visit  02/20/2024 QUINNIE BARCELO [id number]  Number of Integrated Behavioral Health Clinician visits: 3- Third Visit  Session Start time: 1015   Session End time: 1046  Total time in minutes: 31    Referring Provider: Dr. Eveline Patient/Family location: In her car [facility] Provider location: Remote office All persons participating in visit: [facility] and Patient Types of Service: Individual psychotherapy and Video visit  I connected with [name] Homeland and/or Coda D Armentor's patient via  Telephone or Video Enabled Telemedicine Application  (Video is Caregility application) and verified that I am speaking with the correct person using two identifiers. Discussed confidentiality: Yes   I discussed the limitations of telemedicine and the availability of in person appointments.  Discussed there is a possibility of technology failure and discussed alternative modes of communication if that failure occurs.  I discussed that engaging in this telemedicine visit, they consent to the provision of behavioral healthcare and the services will be billed under their insurance.  Patient and/or legal guardian expressed understanding and consented to Telemedicine visit: Yes   Presenting Concerns: Patient and/or family reports the following symptoms/concerns: Improvements with mood and relationships.  Duration of problem: Months; Severity of problem: moderate  Patient and/or Family's Strengths/Protective Factors: Social connections and Sense of purpose  Goals Addressed: Patient will:  Reduce symptoms of: depression   Increase knowledge and/or ability of: coping skills, healthy habits, and self-management skills   Demonstrate ability to: Increase healthy adjustment to current life circumstances  Progress towards Goals: Ongoing    Interventions: Interventions utilized:  Mindfulness or Management consultant, Supportive Counseling, Psychoeducation and/or  Health Education, Communication Skills, and Supportive Reflection Standardized Assessments completed: Not Needed    Patient and/or Family Response: Patient attended today's virtual session and reported having a great weekend, which she spent with her partner and her partner's son celebrating the 4th of July and attending a fireworks event. She noted continued improvement in household communication and cooperation around chores, describing these interactions as going well. Although she was unable to engage in some of her regular coping strategies, such as line dancing and working on puzzles, she identified spending quality time with family as a positive substitute. 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 She also discussed feeling conflicted, citing loyalty to her current employer and guilt about potentially leaving, while acknowledging that the new opportunity offers higher pay, a promotion, and greater potential for growth. Patient is experiencing a mix of anxiety and guilt related to career transition, along with ongoing efforts to maintain emotional balance through positive family engagement.  Clinical Assessment/Diagnosis  Adjustment disorder with depressed mood    Assessment: Patient currently experiencing mix of anxiety and guilt related to an upcoming job interview and the possibility of leaving her current employer, despite recognizing the new role as a positive opportunity. She is also navigating feelings of responsibility and growth within her personal and family life..   Patient may benefit from continued support of integrated behavioral health services.   Plan: Follow up with behavioral health clinician on : 03/01/24 Behavioral recommendations:Patient to continue engaging in meaningful family interactions while incorporating small, manageable coping activities to  support emotional regulation. It is also recommended that the patient practice self-compassion and use positive self-talk to reduce guilt and increase confidence as she approaches this career transition. Referral(s): Integrated [employer] (In Clinic)  I  discussed the assessment and treatment plan with the patient and/or parent/guardian. They were provided an opportunity to ask questions and all were answered. They agreed with the plan and demonstrated an understanding of the instructions.   They were advised to call back or seek an in-person evaluation if the symptoms worsen or if the condition fails to improve as anticipated.  Peta Peachey LITTIE Seats, LCSWA

## 2024-02-16 NOTE — Telephone Encounter (Signed)
 FYI Only or Action Required?: FYI only for provider.  Patient was last seen in primary care on 11/14/2023 by Geofm Glade PARAS, MD.  Called Nurse Triage reporting Rash.  Symptoms began several days ago.  Interventions attempted: OTC medications: antibiotic ointment.  Symptoms are: gradually worsening.  Triage Disposition: See HCP Within 4 Hours (Or PCP Triage)  Patient/caregiver understands and will follow disposition?: Yes, will follow disposition    Copied from CRM [id number]. Topic: Clinical - Red Word Triage >> Feb 16, 2024  2:03 PM Robinson H wrote: Red Word that prompted transfer to Nurse Triage: Friday started breaking out on hands, spreading, places on face and states it hurts, discharging fluid   ----------------------------------------------------------------------- From previous Reason for Contact - Reason for Disposition  Large or small blisters on skin (i.e., fluid filled bubbles or sacs)  Answer Assessment - Initial Assessment Questions 1. APPEARANCE of RASH: Describe the rash. (e.g., spots, blisters, raised areas, skin peeling, scaly)     Blistery, like parts of my body were left out in the sun 2. SIZE: How big are the spots? (e.g., tip of pen, eraser, coin; inches, centimeters)     Quarter sized spots 3. LOCATION: Where is the rash located?     Hands and face 4. COLOR: What color is the rash? (Note: It is difficult to assess rash color in people with darker-colored skin. When this situation occurs, simply ask the caller to describe what they see.)     red 5. ONSET: When did the rash begin?     3 days ago 6. FEVER: Do you have a fever? If Yes, ask: What is your temperature, how was it measured, and when did it start?     denies 7. ITCHING: Does the rash itch? If Yes, ask: How bad is the itch? (Scale 1-10; or mild, moderate, severe)     7 8. CAUSE: What do you think is causing the rash?     No idea 9. MEDICINE FACTORS: Have you started any new  medicines within the last 2 weeks? (e.g., antibiotics)      denies 10. OTHER SYMPTOMS: Do you have any other symptoms? (e.g., dizziness, headache, sore throat, joint pain)       Joint pain, knee pain 11. PREGNANCY: Is there any chance you are pregnant? When was your last menstrual period?       denies  Protocols used: Rash or Redness - Medical [facility]

## 2024-02-16 NOTE — Patient Instructions (Signed)
  Rebecca JONETTA Lincoln, thank you for joining Teena Shuck, PA-C for today's virtual visit.  While this provider is not your primary care provider (PCP), if your PCP is located in our provider database this encounter information will be shared with them immediately following your visit.   A [facility] MyChart account gives you access to today's visit and all your visits, tests, and labs performed at [facility]  click here if you don't have a [facility] MyChart account or go to [email or web address]  Consent: (Patient) Rebecca Dyer provided verbal consent for this virtual visit at the beginning of the encounter.  Current Medications:  Current Outpatient Medications:    ibuprofen  (ADVIL ) 100 MG/5ML suspension, Take 300 mg by mouth every 6 (six) hours as needed (For back pain). (Patient not taking: Reported on 02/06/2024), Disp: , Rfl:    Magnesium  Citrate (MAGNESIUM  GUMMIES PO), Take by mouth., Disp: , Rfl:    Multiple Vitamins-Minerals (MULTIVITAMIN GUMMIES ADULTS PO), Take by mouth., Disp: , Rfl:    OVER THE COUNTER MEDICATION, Take 10 mg by mouth at bedtime as needed (For sleep). Melatonin Fast Dissolve 10mg  Tablet (Patient not taking: Reported on 02/06/2024), Disp: , Rfl:    Medications ordered in this encounter:  No orders of the defined types were placed in this encounter.    *If you need refills on other medications prior to your next appointment, please contact your pharmacy*  Follow-Up: Call back or seek an in-person evaluation if the symptoms worsen or if the condition fails to improve as anticipated.  [facility] [phone]  Other Instructions Report to ER or urgent care.    If you have been instructed to have an in-person evaluation today at a local Urgent Care facility, please use the link below. It will take you to a list of all of our available [facility], including address, phone number and hours of operation. Please do not  delay care.  [facility]  If you or a family member do not have a primary care provider, use the link below to schedule a visit and establish care. When you choose a [facility] primary care physician or advanced practice provider, you gain a long-term partner in health. Find a Primary Care Provider  Learn more about [facility] in-office and virtual care options:  - Get Care Now

## 2024-02-16 NOTE — Progress Notes (Signed)
 Virtual Visit Consent   Rebecca Dyer, you are scheduled for a virtual visit with a [facility] provider today. Just as with appointments in the office, your consent must be obtained to participate. Your consent will be active for this visit and any virtual visit you may have with one of our providers in the next 365 days. If you have a MyChart account, a copy of this consent can be sent to you electronically.  As this is a virtual visit, video technology does not allow for your provider to perform a traditional examination. This may limit your provider's ability to fully assess your condition. If your provider identifies any concerns that need to be evaluated in person or the need to arrange testing (such as labs, EKG, etc.), we will make arrangements to do so. Although advances in technology are sophisticated, we cannot ensure that it will always work on either your end or our end. If the connection with a video visit is poor, the visit may have to be switched to a telephone visit. With either a video or telephone visit, we are not always able to ensure that we have a secure connection.  By engaging in this virtual visit, you consent to the provision of healthcare and authorize for your insurance to be billed (if applicable) for the services provided during this visit. Depending on your insurance coverage, you may receive a charge related to this service.  I need to obtain your verbal consent now. Are you willing to proceed with your visit today? Rebecca Dyer has provided verbal consent on 02/16/2024 for a virtual visit (video or telephone). Teena Shuck, [place]  Date: 02/16/2024 5:24 PM   Virtual Visit via Video Note   I, Teena Shuck, connected with  Rebecca Dyer  ([id number], 1990/09/07) on 02/16/24 at  5:15 PM EDT by a video-enabled telemedicine application and verified that I am speaking with the correct person using two identifiers.  Location: Patient: Virtual Visit Location Patient:  Other: Car Provider: Virtual Visit Location Provider: Home Office   I discussed the limitations of evaluation and management by telemedicine and the availability of in person appointments. The patient expressed understanding and agreed to proceed.    History of Present Illness: Rebecca Dyer is a 33 y.o. who identifies as a female who was assigned female at birth, and is being seen today for infected finger.  HPI: Hand Pain  The incident occurred more than 1 week ago. The incident occurred at home. The injury mechanism is unknown. The pain is moderate. The pain has been Constant since the incident. The symptoms are aggravated by movement. She has tried nothing for the symptoms. The treatment provided no relief.    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   Syncope, near 09/08/2023   Urinary incontinence 09/08/2023   Hyperglycemia 09/08/2023   Weight gain 09/08/2023   History of depression 04/10/2021   Family history of diabetes mellitus 04/10/2021   History of pyloric stenosis as a child 04/10/2021   Internal hemorrhoids 06/05/2017   ADHD (attention deficit hyperactivity disorder) 09/11/2012    Allergies:  Allergies  Allergen Reactions   Duricef [Cefadroxil Monohydrate] Hives   Latex Swelling, Rash and Other (See Comments)    Burning sensation   Medications:  Current Outpatient Medications:    ibuprofen  (ADVIL ) 100 MG/5ML suspension, Take 300 mg by mouth every 6 (six)  hours as needed (For back pain). (Patient not taking: Reported on 02/06/2024), Disp: , Rfl:    Magnesium  Citrate (MAGNESIUM  GUMMIES PO), Take by mouth., Disp: , Rfl:    Multiple Vitamins-Minerals (MULTIVITAMIN GUMMIES ADULTS PO), Take by mouth., Disp: , Rfl:    OVER THE COUNTER MEDICATION, Take 10 mg by mouth at bedtime as needed (For sleep). Melatonin Fast Dissolve 10mg  Tablet (Patient not taking: Reported on  02/06/2024), Disp: , Rfl:   Observations/Objective: Patient is well-developed, well-nourished in no acute distress.  Resting comfortably  at home.  Head is normocephalic, atraumatic.  No labored breathing.  Speech is clear and coherent with logical content.  Patient is alert and oriented at baseline.  Swollin area to right hand at ring finger. Pain with flexion and extension  Assessment and Plan: There are no diagnoses linked to this encounter. Patient presenting with finger pain after cut ~1week prior pain on exam with [facility]. Concerned  for possible tendon related injury/infection. Advised patient to present in person for assessment. She verbalized understanding and agreed to this plan. No acute emergent condition identified during call.   Follow Up Instructions: I discussed the assessment and treatment plan with the patient. The patient was provided an opportunity to ask questions and all were answered. The patient agreed with the plan and demonstrated an understanding of the instructions.  A copy of instructions were sent to the patient via MyChart unless otherwise noted below.    The patient was advised to call back or seek an in-person evaluation if the symptoms worsen or if the condition fails to improve as anticipated.    Teena Shuck, PA-C

## 2024-02-17 ENCOUNTER — Ambulatory Visit
Admission: EM | Admit: 2024-02-17 | Discharge: 2024-02-17 | Disposition: A | Discharge status: Home / Self Care | Attending: Emergency Medicine | Admitting: Emergency Medicine

## 2024-02-17 ENCOUNTER — Other Ambulatory Visit: Payer: Self-pay

## 2024-02-17 ENCOUNTER — Encounter: Payer: Self-pay | Admitting: Emergency Medicine

## 2024-02-17 DIAGNOSIS — R21 Rash and other nonspecific skin eruption: Secondary | ICD-10-CM

## 2024-02-17 MED ORDER — PREDNISOLONE 15 MG/5ML PO SOLN: 40.0000 mg | Freq: Every day | ORAL | 0 refills | Status: AC

## 2024-02-17 MED ORDER — DOXYCYCLINE MONOHYDRATE 25 MG/5ML PO SUSR: 100.0000 mg | Freq: Two times a day (BID) | ORAL | 0 refills | Status: AC

## 2024-02-17 NOTE — Discharge Instructions (Addendum)
  Hands for face for 4 days,  Felt like a papercut then liked a blister from a mosquito bite, has increased burning itching,  Now on chin, bgack of hands,  Neosporin, alcohol, helfpul  Nothng of feet, no contact, 1st occurrence,   Changed soap and deodrant, nothing in those areas,   No fever, cold symptoms  Your evaluated for the rash on the finger  As you are seeing puslike drainage concerning for infection therefore you have been started on antibiotics  Take doxycycline  twice daily for 7 days, this antibiotic can make your skin sensitive to the sunlight therefore ensure that you are wearing sunscreen when outdoors  For coverage for reaction to something that the fingers come in contact with, take prednisolone  every morning for 5 days  You may apply any topical medicines over the affected area additionally for comfort  You may apply ice or heat over the affected area  If your symptoms do not improve you may follow-up with urgent care or dermatology for further evaluation and management

## 2024-02-17 NOTE — ED Triage Notes (Signed)
 Pt sts rash to hands and face x 4 days

## 2024-02-17 NOTE — ED Provider Notes (Signed)
 [facility]    CSN: [id number] Arrival date & time: 02/17/24  1117      History   Chief Complaint Chief Complaint  Patient presents with   Rash    HPI Rebecca Dyer is a 33 y.o. female.   Patient presents for evaluation of a possible rash present to the hands and the face beginning 4 days ago.  Denies changes in soaps and lotions but does not have symptoms where contact is predominantly made.  Endorses some drainage from the right hand varying from yellow to clear in coloration.  Associated pruritus and a burning sensation.  It started off as a small blister then progressively worsened.  Has not attempted treatment.  No known contact with similar symptoms.  Has not occurred prior.  Past Medical History:  Diagnosis Date   ADHD (attention deficit hyperactivity disorder)    Depression    GERD (gastroesophageal reflux disease)    IBS (irritable bowel syndrome)     Patient Active Problem List   Diagnosis Date Noted   Fibroid 11/14/2023   Abdominal pain 11/14/2023   Lesion of liver 11/13/2023   Syncope, near 09/08/2023   Urinary incontinence 09/08/2023   Hyperglycemia 09/08/2023   Weight gain 09/08/2023   History of depression 04/10/2021   Family history of diabetes mellitus 04/10/2021   History of pyloric stenosis as a child 04/10/2021   Internal hemorrhoids 06/05/2017   ADHD (attention deficit hyperactivity disorder) 09/11/2012    Past Surgical History:  Procedure Laterality Date   pyloric stenosis  1994   UMBILICAL HERNIA REPAIR  1998   as a child   WISDOM TOOTH EXTRACTION Bilateral 2010    OB History     Gravida  0   Para  0   Term      Preterm      AB      Living         SAB      IAB      Ectopic      Multiple      Live Births               Home Medications    Prior to Admission medications   Medication Sig Start Date End Date Taking? Authorizing Provider  doxycycline  (VIBRAMYCIN ) 25 MG/5ML SUSR Take 20 mLs (100  mg total) by mouth 2 (two) times daily for 7 days. 02/17/24 02/24/24 Yes Paden Kuras R, NP  prednisoLONE  (PRELONE ) 15 MG/5ML SOLN Take 13.3 mLs (40 mg total) by mouth daily before breakfast for 5 days. 02/17/24 02/22/24 Yes Eldene Plocher R, NP  ibuprofen  (ADVIL ) 100 MG/5ML suspension Take 300 mg by mouth every 6 (six) hours as needed (For back pain). Patient not taking: Reported on 02/06/2024    [provider]  Magnesium  Citrate (MAGNESIUM  GUMMIES PO) Take by mouth.    [provider]  Multiple Vitamins-Minerals (MULTIVITAMIN GUMMIES ADULTS PO) Take by mouth.    [provider]  OVER THE COUNTER MEDICATION Take 10 mg by mouth at bedtime as needed (For sleep). Melatonin Fast Dissolve 10mg  Tablet Patient not taking: Reported on 02/06/2024    [provider]    Family History Family History  Adopted: Yes  Problem Relation Age of Onset   Hypertension Mother    Drug abuse Mother    Drug abuse Father    Breast cancer Maternal Aunt        age unknown   Colon cancer Neg Hx  Social History Social History   Tobacco Use   Smoking status: Former   Smokeless tobacco: Never  Advertising account planner   Vaping status: Some Days  Substance Use Topics   Alcohol use: Yes    Comment: socially   Drug use: Yes    Types: Marijuana     Allergies   Duricef [cefadroxil monohydrate] and Latex   Review of Systems Review of Systems   Physical Exam Triage Vital Signs ED Triage Vitals [02/17/24 1132]  Encounter Vitals Group     BP 102/62     Girls Systolic BP Percentile      Girls Diastolic BP Percentile      Boys Systolic BP Percentile      Boys Diastolic BP Percentile      Pulse Rate 70     Resp 18     Temp 98.1 F (36.7 C)     Temp Source Oral     SpO2 98 %     Weight      Height      Head Circumference      Peak Flow      Pain Score 0     Pain Loc      Pain Education      Exclude from Growth Chart    No data found.  Updated Vital Signs BP 102/62  (BP Location: Left Arm)   Pulse 70   Temp 98.1 F (36.7 C) (Oral)   Resp 18   LMP 01/29/2024 (Exact Date)   SpO2 98%   Visual Acuity Right Eye Distance:   Left Eye Distance:   Bilateral Distance:    Right Eye Near:   Left Eye Near:    Bilateral Near:     Physical Exam Constitutional:      Appearance: Normal appearance.  Eyes:     Extraocular Movements: Extraocular movements intact.  Pulmonary:     Effort: Pulmonary effort is normal.  Skin:    Comments: Refer to photo below, has 1 similar lesion to the left hand on the chin  Neurological:     Mental Status: She is alert and oriented to person, place, and time.         UC Treatments / Results  Labs (all labs ordered are listed, but only abnormal results are displayed) Labs Reviewed - No data to display  EKG   Radiology No results found.  Procedures Procedures (including critical care time)  Medications Ordered in UC Medications - No data to display  Initial Impression / Assessment and Plan / UC Course  I have reviewed the triage vital signs and the nursing notes.  Pertinent labs & imaging results that were available during my care of the patient were reviewed by me and considered in my medical decision making (see chart for details).  Rebecca Dyer  Providing coverage today for bacteria as well as an inflammatory process, patient endorsing purulent drainage, prescribed doxycycline  and prednisone  and advised follow-up with dermatology if no improvement Final Clinical Impressions(s) / UC Diagnoses   Final diagnoses:  Rash and nonspecific skin eruption     Discharge Instructions       Hands for face for 4 days,  Felt like a papercut then liked a blister from a mosquito bite, has increased burning itching,  Now on chin, bgack of hands,  Neosporin, alcohol, helfpul  Nothng of feet, no contact, 1st occurrence,   Changed soap and deodrant, nothing in those areas,   No fever, cold symptoms  Your  evaluated for the rash on the finger  As you are seeing puslike drainage concerning for infection therefore you have been started on antibiotics  Take doxycycline  twice daily for 7 days, this antibiotic can make your skin sensitive to the sunlight therefore ensure that you are wearing sunscreen when outdoors  For coverage for reaction to something that the fingers come in contact with, take prednisolone  every morning for 5 days  You may apply any topical medicines over the affected area additionally for comfort  You may apply ice or heat over the affected area  If your symptoms do not improve you may follow-up with urgent care or dermatology for further evaluation and management   ED Prescriptions     Medication Sig Dispense Auth. Provider   doxycycline  (VIBRAMYCIN ) 25 MG/5ML SUSR Take 20 mLs (100 mg total) by mouth 2 (two) times daily for 7 days. 240 mL Rebecca Dyer R, NP   prednisoLONE  (PRELONE ) 15 MG/5ML SOLN Take 13.3 mLs (40 mg total) by mouth daily before breakfast for 5 days. 67 mL Rebecca Dyer, Rebecca SAUNDERS, NP      PDMP not reviewed this encounter.   Rebecca Rebecca SAUNDERS, NP 02/17/24 1215

## 2024-02-19 ENCOUNTER — Telehealth: Payer: Self-pay | Admitting: Internal Medicine

## 2024-02-19 NOTE — Telephone Encounter (Signed)
 Copied from CRM [id number]. Topic: Referral - Request for Referral >> Feb 19, 2024  9:38 AM Rebecca Dyer wrote: Did the patient discuss referral with their provider in the last year? Yes Referral# [id number] (If No - schedule appointment) (If Yes - send message)  Appointment offered? Yes Referral# [id number]  Type of order/referral and detailed reason for visit: [facility]  Preference of office, provider, location: [address] [place], [place], [place] [address] [phone]   If referral order, have you been seen by this specialty before? No (If Yes, this issue or another issue? When? Where?  Can we respond through MyChart? No

## 2024-02-19 NOTE — Telephone Encounter (Signed)
 Message left for patient with info.  If she returns call regarding status please inform her.  Thanks!

## 2024-02-19 NOTE — Telephone Encounter (Signed)
 Spoke with Ronal and patient's shara had expired so it will need to be completed again. It was previously approved.  She will have [facility] call her to schedule.

## 2024-03-01 ENCOUNTER — Ambulatory Visit: Admitting: Licensed Clinical Social Worker

## 2024-03-01 DIAGNOSIS — F4321 Adjustment disorder with depressed mood: Secondary | ICD-10-CM | POA: Diagnosis not present

## 2024-03-01 NOTE — BH Specialist Note (Signed)
 Integrated Behavioral Health via Telemedicine Visit  03/07/2024 Rebecca Dyer [id number]  Number of Integrated Behavioral Health Clinician visits: 4- Fourth Visit  Session Start time: 1025  Session End time: 1127  Total time in minutes: 62    Referring Provider: Dr. Eveline Patient/Family location: At [facility] Provider location: Remote Office  All persons participating in visit: Patient and [facility] Types of Service: Individual psychotherapy and Video visit  I connected with Rebecca [place] and/or Rebecca Dyer's patient via  Telephone or Video Enabled Telemedicine Application  (Video is Caregility application) and verified that I am speaking with the correct person using two identifiers. Discussed confidentiality: Yes   I discussed the limitations of telemedicine and the availability of in person appointments.  Discussed there is a possibility of technology failure and discussed alternative modes of communication if that failure occurs.  I discussed that engaging in this telemedicine visit, they consent to the provision of behavioral healthcare and the services will be billed under their insurance.  Patient and/or legal guardian expressed understanding and consented to Telemedicine visit: Yes   Presenting Concerns: Patient and/or family reports the following symptoms/concerns: Improvements in mood  Duration of problem: Months; Severity of problem: moderate  Patient and/or Family's Strengths/Protective Factors: Concrete supports in place (healthy food, safe environments, etc.), Sense of purpose, and Physical Health (exercise, healthy diet, medication compliance, etc.)  Goals Addressed: Patient will:  Reduce symptoms of: depression   Increase knowledge and/or ability of: coping skills, healthy habits, and self-management skills   Demonstrate ability to: Increase healthy adjustment to current life circumstances  Progress towards  Goals: Ongoing    Interventions: Interventions utilized:  Mindfulness or Management consultant, Supportive Counseling, Psychoeducation and/or Health Education, Communication Skills, and Supportive Reflection Standardized Assessments completed: Not Needed    Patient and/or Family Response:The patient attended today's virtual session and actively reflected on her week, noting positive experiences including a family trip to a water park and a night out with friends--neither of which provoked any anxiety or depressive symptoms. 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 She is looking forward to attending a family reunion soon and having her partner meet her relatives. While the patient acknowledges some mild nervousness during thunderstorms, she effectively manages these episodes using eye masks and ear plugs, and plans to increase weather awareness to reduce unexpected triggers. She also shared that she has improved her diet by cutting out carbohydrates, which she notes has enhanced her mood and overall well-being.   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   Patient may benefit from continued support of integrated behavioral health.  Plan: Follow up with behavioral health clinician on : 03/08/2024 Behavioral recommendations: Continue to nurture positive mood and resilience by maintaining balanced social engagement and regular exercise (e.g., 30-60 minutes  of moderate activity most days), which supports serotonin release, stress reduction, and self-esteem. Think about establishing a transition plan for the new role with actionable short-term and long term goals,  Referral(s): Integrated [employer] (In Clinic)  I discussed the assessment  and treatment plan with the patient and/or parent/guardian. They were provided an opportunity to ask questions and all were answered. They agreed with the plan and demonstrated an understanding of the instructions.   They were advised to call back or seek an in-person evaluation if the symptoms worsen or if the condition fails to improve as anticipated.  Rebecca Dyer, LCSWA

## 2024-03-05 ENCOUNTER — Encounter: Payer: Self-pay | Admitting: Internal Medicine

## 2024-03-08 ENCOUNTER — Ambulatory Visit: Admitting: Licensed Clinical Social Worker

## 2024-03-08 DIAGNOSIS — F4323 Adjustment disorder with mixed anxiety and depressed mood: Secondary | ICD-10-CM | POA: Diagnosis not present

## 2024-03-08 NOTE — BH Specialist Note (Signed)
 Integrated Behavioral Health via Telemedicine Visit   Rebecca Dyer [id number]  Number of Integrated Behavioral Health Clinician visits: 5-Fifth Visit  Session Start time: 1045   Session End time: 1125  Total time in minutes: 40    Referring Provider: Dr. Eveline  Patient/Family location: At [facility] Provider location: Remote Office All persons participating in visit: Patient and [facility] Types of Service: Individual psychotherapy and Video visit  I connected with [name] and/or [name]'s patient via  Telephone or Video Enabled Telemedicine Application  (Video is Caregility application) and verified that I am speaking with the correct person using two identifiers. Discussed confidentiality: Yes   I discussed the limitations of telemedicine and the availability of in person appointments.  Discussed there is a possibility of technology failure and discussed alternative modes of communication if that failure occurs.  I discussed that engaging in this telemedicine visit, they consent to the provision of behavioral healthcare and the services will be billed under their insurance.  Patient and/or legal guardian expressed understanding and consented to Telemedicine visit: Yes   Presenting Concerns: Patient and/or family reports the following symptoms/concerns: Increased interpersonal relationships  Duration of problem: Months; Severity of problem: moderate  Patient and/or Family's Strengths/Protective Factors: Social and Emotional competence, Concrete supports in place (healthy food, safe environments, etc.), and Physical Health (exercise, healthy diet, medication compliance, etc.)  Goals Addressed: Patient will:  Reduce symptoms of: anxiety and depression   Increase knowledge and/or ability of: coping skills, healthy habits, and self-management skills   Demonstrate ability to: Increase healthy adjustment to current life circumstances and Increase adequate support  systems for patient/family  Progress towards Goals: Revised    Interventions: Interventions utilized:  Supportive Counseling, Communication Skills, and Supportive Reflection Standardized Assessments completed: Not Needed    Patient and/or Family Response: Patient was present for today's virtual session and processed her recent family reunion in [place] . She shared feeling joy in spending time with family and reported that her girlfriend and son were able to attend and meet some of her relatives, which she experienced positively. She also discussed interpersonal challenges during the trip, including disagreements with her brother and girlfriend, which led to feelings of discomfort. Additionally, the patient shared that her last day at [employer] is August 2nd, and she is looking forward to beginning a new job on August 6th, expressing excitement and optimism about this transition.   Clinical Assessment/Diagnosis  Adjustment disorder with mixed anxiety and depressed mood    Assessment: Patient currently experiencing mixed emotions, reporting both enjoyment and emotional discomfort related to family dynamics during the reunion. She also reports a sense of anticipation and motivation related to her upcoming job transition..   Patient may benefit from continued support of integrated behavioral health services.  Plan: Follow up with behavioral health clinician on : 03/15/2024 Behavioral recommendations:  continuing to explore and process family relationship dynamics in future sessions, and utilizing communication and emotion regulation strategies when navigating conflict. Referral(s): Integrated [employer] (In Clinic)  I discussed the assessment and treatment plan with the patient and/or parent/guardian. They were provided an opportunity to ask questions and all were answered. They agreed with the plan and demonstrated an understanding of the instructions.   They were advised  to call back or seek an in-person evaluation if the symptoms worsen or if the condition fails to improve as anticipated.  Marcene Laskowski LITTIE Seats, LCSWA

## 2024-03-09 ENCOUNTER — Other Ambulatory Visit

## 2024-03-15 ENCOUNTER — Ambulatory Visit
Admission: RE | Admit: 2024-03-15 | Discharge: 2024-03-15 | Disposition: A | Discharge status: Home / Self Care | Source: Ambulatory Visit | Attending: Internal Medicine

## 2024-03-15 ENCOUNTER — Ambulatory Visit: Admitting: Licensed Clinical Social Worker

## 2024-03-15 DIAGNOSIS — F4323 Adjustment disorder with mixed anxiety and depressed mood: Secondary | ICD-10-CM

## 2024-03-15 DIAGNOSIS — K769 Liver disease, unspecified: Secondary | ICD-10-CM

## 2024-03-15 MED ORDER — GADOPICLENOL 0.5 MMOL/ML IV SOLN
8.0000 mL | Freq: Once | INTRAVENOUS | Status: AC | PRN
  Administered 2024-03-15: 8 mL via INTRAVENOUS

## 2024-03-15 NOTE — BH Specialist Note (Signed)
 Integrated Behavioral Health via Telemedicine Visit  03/23/2024 Rebecca Dyer [id number]  Number of Integrated Behavioral Health Clinician visits: 5-Fifth Visit  Session Start time: 1045   Session End time: 1125  Total time in minutes: 40    Referring Provider: Dr. Eveline  Patient/Family location: At [facility] Provider location: Remote Office All persons participating in visit: Patient and [facility] Types of Service: Individual psychotherapy and Video visit  I connected with Rebecca Dyer and/or Rebecca Dyer's patient via  Telephone or Video Enabled Telemedicine Application  (Video is Caregility application) and verified that I am speaking with the correct person using two identifiers. Discussed confidentiality: Yes   I discussed the limitations of telemedicine and the availability of in person appointments.  Discussed there is a possibility of technology failure and discussed alternative modes of communication if that failure occurs.  I discussed that engaging in this telemedicine visit, they consent to the provision of behavioral healthcare and the services will be billed under their insurance.  Patient and/or legal guardian expressed understanding and consented to Telemedicine visit: Yes   Presenting Concerns: Patient and/or family reports the following symptoms/concerns: improvements with mood and symptoms Duration of problem: months; Severity of problem: moderate  Patient and/or Family's Strengths/Protective Factors: Social and Emotional competence, Concrete supports in place (healthy food, safe environments, etc.), and Physical Health (exercise, healthy diet, medication compliance, etc.)  Goals Addressed: Patient will:  Reduce symptoms of: anxiety and depression   Increase knowledge and/or ability of: coping skills, healthy habits, and self-management skills   Demonstrate ability to: Increase healthy adjustment to current life circumstances and Increase adequate  support systems for patient/family  Progress towards Goals: Ongoing    Interventions: Interventions utilized:  Supportive Counseling, Psychoeducation and/or Health Education, and Supportive Reflection Standardized Assessments completed: Not Needed    Patient and/or Family Response: Patient was present for today's session. She reported having a productive and emotionally supportive conversation with her girlfriend, during which she was able to express her thoughts and feelings openly. She described feeling relieved and empowered by the experience, noting the positive impact of not holding emotions in. Patient continues to utilize coping strategies effectively and reports ongoing engagement in pleasurable and social activities, including line dancing on Fridays, attending two baseball games, and playing frisbee with friends. She reported an overall improvement in mood and a noticeable decrease in stress and symptoms.  Clinical Assessment/Diagnosis  Adjustment disorder with mixed anxiety and depressed mood    Assessment: Patient currently experiencing improved mood, reduced stress, and a sense of emotional relief after effectively communicating with her partner. She reports increased engagement in social and recreational activities, contributing to her overall well-being..   Patient may benefit from continued support of integrated behavioral health.  Plan: Follow up with behavioral health clinician on : 03/30/2024 Behavioral recommendations:  patient to utilize healthy coping strategies and maintain engagement in supportive social and recreational activities. Recommend ongoing therapy to reinforce emotional expression, relationship communication, and stress management skills. Referral(s): Integrated [employer] (In Clinic)  I discussed the assessment and treatment plan with the patient and/or parent/guardian. They were provided an opportunity to ask questions and all were  answered. They agreed with the plan and demonstrated an understanding of the instructions.   They were advised to call back or seek an in-person evaluation if the symptoms worsen or if the condition fails to improve as anticipated.  Rebecca Dyer Seats, LCSWA

## 2024-03-17 ENCOUNTER — Encounter: Payer: Self-pay | Admitting: Obstetrics and Gynecology

## 2024-03-21 ENCOUNTER — Ambulatory Visit: Payer: Self-pay | Admitting: Internal Medicine

## 2024-03-21 DIAGNOSIS — K769 Liver disease, unspecified: Secondary | ICD-10-CM

## 2024-03-30 ENCOUNTER — Encounter: Admitting: Licensed Clinical Social Worker

## 2024-03-31 ENCOUNTER — Ambulatory Visit (INDEPENDENT_AMBULATORY_CARE_PROVIDER_SITE_OTHER): Admitting: Licensed Clinical Social Worker

## 2024-03-31 DIAGNOSIS — F4323 Adjustment disorder with mixed anxiety and depressed mood: Secondary | ICD-10-CM | POA: Diagnosis not present

## 2024-03-31 NOTE — BH Specialist Note (Signed)
 Integrated Behavioral Health via Telemedicine Visit  04/05/2024 Rebecca Dyer [id number]  Number of Integrated Behavioral Health Clinician visits: 6-Sixth Visit  Session Start time: 1130   Session End time: 1204  Total time in minutes: 34    Referring Provider: Dr. Eveline  Patient/Family location: At home  [facility] Provider location: Remote Office All persons participating in visit: Patient and [facility] Types of Service: Individual psychotherapy and Video visit  I connected with Rebecca Dyer and/or Rebecca Dyer's patient via  Telephone or Video Enabled Telemedicine Application  (Video is Caregility application) and verified that I am speaking with the correct person using two identifiers. Discussed confidentiality: Yes   I discussed the limitations of telemedicine and the availability of in person appointments.  Discussed there is a possibility of technology failure and discussed alternative modes of communication if that failure occurs.  I discussed that engaging in this telemedicine visit, they consent to the provision of behavioral healthcare and the services will be billed under their insurance.  Patient and/or legal guardian expressed understanding and consented to Telemedicine visit: Yes   Presenting Concerns: Patient and/or family reports the following symptoms/concerns: Ongoing improvements  Duration of problem: Months; Severity of problem: moderate  Patient and/or Family's Strengths/Protective Factors: Social connections, Social and Emotional competence, Concrete supports in place (healthy food, safe environments, etc.), and Physical Health (exercise, healthy diet, medication compliance, etc.)  Goals Addressed: Patient will:  Reduce symptoms of: anxiety and depression   Increase knowledge and/or ability of: coping skills, healthy habits, and self-management skills   Demonstrate ability to: Increase healthy adjustment to current life circumstances and Increase adequate  support systems for patient/family  Progress towards Goals: Ongoing    Interventions: Interventions utilized:  Supportive Counseling, Psychoeducation and/or Health Education, Communication Skills, and Supportive Reflection Standardized Assessments completed: Not Needed    Patient and/or Family Response: Patient reports she recently started a new job and is enjoying the experience, stating that she feels valued and has been recognized for her hard work. She shared that she has also been helping female coworkers, which she finds fulfilling. Patient provided a medical update, noting that a recent MRI revealed a benign liver tumor. She has a follow-up MRI scheduled for 09/15/2024. Patient described her mood over the past few weeks as good and reported looking forward to celebrating her anniversary and [place] at the [place] with her girlfriend. She identified several coping strategies she has been using, including playing frisbee, video games, and box car games with her girlfriend, which have been effective in supporting her emotional well-being.  Clinical Assessment/Diagnosis  Adjustment disorder with mixed anxiety and depressed mood    Assessment: Patient currently experiencing stable mood and increased satisfaction in both her work and personal life. She is effectively using recreational activities and social connection as coping strategies and is managing her health concerns with appropriate follow-up..   Patient may benefit from continued support of integrated behavioral health.  Plan: Follow up with behavioral health clinician on : 04/14/24 Behavioral recommendations: Recommend continued engagement in coping strategies, maintaining medical follow-up, and using upcoming positive events as sources of motivation.  Referral(s): Integrated [employer] (In Clinic)  I discussed the assessment and treatment plan with the patient and/or parent/guardian. They were provided an  opportunity to ask questions and all were answered. They agreed with the plan and demonstrated an understanding of the instructions.   They were advised to call back or seek an in-person evaluation if the symptoms worsen or if  the condition fails to improve as anticipated.  Loukas Antonson LITTIE Seats, LCSWA

## 2024-04-14 ENCOUNTER — Ambulatory Visit: Admitting: Licensed Clinical Social Worker

## 2024-04-14 NOTE — BH Specialist Note (Signed)
 Patient bo showed for today's visit.

## 2024-06-14 ENCOUNTER — Encounter: Payer: Self-pay | Admitting: Radiology

## 2024-06-14 ENCOUNTER — Encounter: Payer: Self-pay | Admitting: Internal Medicine

## 2024-06-14 NOTE — Progress Notes (Deleted)
    Subjective:    Patient ID: Rebecca Dyer, female    DOB: 05/22/1991, 33 y.o.   MRN: [id number]      HPI Rebecca Dyer is here for No chief complaint on file.        Medications and allergies reviewed with patient and updated if appropriate.  Current Outpatient Medications on File Prior to Visit  Medication Sig Dispense Refill   ibuprofen  (ADVIL ) 100 MG/5ML suspension Take 300 mg by mouth every 6 (six) hours as needed (For back pain). (Patient not taking: Reported on 02/06/2024)     Magnesium  Citrate (MAGNESIUM  GUMMIES PO) Take by mouth.     Multiple Vitamins-Minerals (MULTIVITAMIN GUMMIES ADULTS PO) Take by mouth.     OVER THE COUNTER MEDICATION Take 10 mg by mouth at bedtime as needed (For sleep). Melatonin Fast Dissolve 10mg  Tablet (Patient not taking: Reported on 02/06/2024)     No current facility-administered medications on file prior to visit.    Review of Systems     Objective:  There were no vitals filed for this visit. BP Readings from Last 3 Encounters:  02/17/24 102/62  02/06/24 110/72  01/21/24 115/78   Wt Readings from Last 3 Encounters:  02/06/24 171 lb 9.6 oz (77.8 kg)  01/21/24 177 lb (80.3 kg)  11/14/23 170 lb (77.1 kg)   There is no height or weight on file to calculate BMI.    Physical Exam         Assessment & Plan:    See Problem List for Assessment and Plan of chronic medical problems.

## 2024-06-15 ENCOUNTER — Ambulatory Visit: Admitting: Internal Medicine

## 2024-06-15 NOTE — Progress Notes (Unsigned)
    Subjective:    Patient ID: [name] [place], female    DOB: 05/22/1991, 33 y.o.   MRN: [id number]      HPI [name] is here for No chief complaint on file.        Medications and allergies reviewed with patient and updated if appropriate.  Current Outpatient Medications on File Prior to Visit  Medication Sig Dispense Refill   ibuprofen  (ADVIL ) 100 MG/5ML suspension Take 300 mg by mouth every 6 (six) hours as needed (For back pain). (Patient not taking: Reported on 02/06/2024)     Magnesium  Citrate (MAGNESIUM  GUMMIES PO) Take by mouth.     Multiple Vitamins-Minerals (MULTIVITAMIN GUMMIES ADULTS PO) Take by mouth.     OVER THE COUNTER MEDICATION Take 10 mg by mouth at bedtime as needed (For sleep). Melatonin Fast Dissolve 10mg  Tablet (Patient not taking: Reported on 02/06/2024)     No current facility-administered medications on file prior to visit.    Review of Systems     Objective:  There were no vitals filed for this visit. BP Readings from Last 3 Encounters:  02/17/24 102/62  02/06/24 110/72  01/21/24 115/78   Wt Readings from Last 3 Encounters:  02/06/24 171 lb 9.6 oz (77.8 kg)  01/21/24 177 lb (80.3 kg)  11/14/23 170 lb (77.1 kg)   There is no height or weight on file to calculate BMI.    Physical Exam         Assessment & Plan:    See Problem List for Assessment and Plan of chronic medical problems.

## 2024-06-16 ENCOUNTER — Ambulatory Visit: Admitting: Internal Medicine

## 2024-06-16 ENCOUNTER — Encounter: Payer: Self-pay | Admitting: Internal Medicine

## 2024-06-16 VITALS — BP 114/74 | HR 70 | Temp 98.0°F | Ht 63.0 in | Wt 177.0 lb

## 2024-06-16 DIAGNOSIS — R1030 Lower abdominal pain, unspecified: Secondary | ICD-10-CM

## 2024-06-16 DIAGNOSIS — K429 Umbilical hernia without obstruction or gangrene: Secondary | ICD-10-CM | POA: Insufficient documentation

## 2024-06-16 DIAGNOSIS — K921 Melena: Secondary | ICD-10-CM | POA: Insufficient documentation

## 2024-06-16 NOTE — Assessment & Plan Note (Signed)
 New States recent episode of blood and mucus in her stool.  No current symptoms.  She states she has experienced this in the past and is unsure what the cause was Referral to GI for further evaluation

## 2024-06-16 NOTE — Assessment & Plan Note (Signed)
 Earlier this year she was experiencing some abdominal pain This pain has resolved The only pain she has is up by her xiphoid process which I think is musculoskeletal Has a's very small periumbilical hernia which is not tender Imaging revealed liver lesion-possibly hemangioma-to have follow-up imaging in a couple of months Has a large uterine fibroid

## 2024-06-16 NOTE — Patient Instructions (Signed)
       Medications changes include :   None    A referral was ordered Rebecca Dyer for the blood in the stool and someone will call you to schedule an appointment.

## 2024-06-16 NOTE — Assessment & Plan Note (Signed)
 Chronic Tiny on imaging earlier this year Has discomfort only with certain positions, not with lifting or any other activities Saw surgeon-I do not recommend repair because of its size and minimal symptoms This does not interfere with any work activities and she is cleared to work

## 2024-09-10 ENCOUNTER — Encounter: Payer: Self-pay | Admitting: Internal Medicine

## 2024-09-15 ENCOUNTER — Other Ambulatory Visit
